# Patient Record
Sex: Female | Born: 1995 | Race: Black or African American | Hispanic: No | Marital: Single | Smoking: Current some day smoker
Health system: Southern US, Community
[De-identification: ages and names within clinical notes are randomized; demographics above are authoritative.]

## PROBLEM LIST (undated history)

## (undated) ENCOUNTER — Inpatient Hospital Stay (HOSPITAL_COMMUNITY): Payer: Self-pay

## (undated) DIAGNOSIS — B999 Unspecified infectious disease: Secondary | ICD-10-CM

## (undated) DIAGNOSIS — Z789 Other specified health status: Secondary | ICD-10-CM

## (undated) DIAGNOSIS — F419 Anxiety disorder, unspecified: Secondary | ICD-10-CM

## (undated) HISTORY — PX: NO PAST SURGERIES: SHX2092

## (undated) HISTORY — PX: THERAPEUTIC ABORTION: SHX798

---
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
## 2014-04-15 ENCOUNTER — Other Ambulatory Visit (HOSPITAL_COMMUNITY)
Admission: RE | Admit: 2014-04-15 | Discharge: 2014-04-15 | Disposition: A | Discharge status: Home / Self Care | Payer: 59 | Source: Ambulatory Visit | Attending: Emergency Medicine | Admitting: Emergency Medicine

## 2014-04-15 ENCOUNTER — Encounter (HOSPITAL_COMMUNITY): Payer: Self-pay

## 2014-04-15 ENCOUNTER — Emergency Department (INDEPENDENT_AMBULATORY_CARE_PROVIDER_SITE_OTHER)
Admission: EM | Admit: 2014-04-15 | Discharge: 2014-04-15 | Disposition: A | Discharge status: Home / Self Care | Payer: 59 | Source: Home / Self Care | Attending: Emergency Medicine | Admitting: Emergency Medicine

## 2014-04-15 DIAGNOSIS — Z113 Encounter for screening for infections with a predominantly sexual mode of transmission: Secondary | ICD-10-CM | POA: Insufficient documentation

## 2014-04-15 DIAGNOSIS — N760 Acute vaginitis: Secondary | ICD-10-CM | POA: Diagnosis present

## 2014-04-15 DIAGNOSIS — L253 Unspecified contact dermatitis due to other chemical products: Secondary | ICD-10-CM

## 2014-04-15 MED ORDER — PREDNISONE 10 MG PO TABS: ORAL_TABLET | ORAL | Status: DC

## 2014-04-15 MED ORDER — PREDNISONE 20 MG PO TABS
60.0000 mg | ORAL_TABLET | Freq: Once | ORAL | Status: AC
  Administered 2014-04-15: 60 mg via ORAL

## 2014-04-15 MED ORDER — IBUPROFEN 800 MG PO TABS
ORAL_TABLET | ORAL | Status: AC
  Filled 2014-04-15: qty 1

## 2014-04-15 MED ORDER — IBUPROFEN 800 MG PO TABS
800.0000 mg | ORAL_TABLET | Freq: Once | ORAL | Status: AC
  Administered 2014-04-15: 800 mg via ORAL

## 2014-04-15 MED ORDER — PREDNISONE 20 MG PO TABS
ORAL_TABLET | ORAL | Status: AC
  Filled 2014-04-15: qty 3

## 2014-04-15 NOTE — ED Notes (Signed)
Provider evaluation only 

## 2014-04-15 NOTE — ED Provider Notes (Signed)
CSN: [id number]     Arrival date & time 04/15/14  1717 History   First MD Initiated Contact with Patient 04/15/14 1938     Chief Complaint  Patient presents with  . Vaginitis   (Consider location/radiation/quality/duration/timing/severity/associated sxs/prior Treatment) HPI Comments: Patient states she had intercourse last night and latex condom was used. This morning she woke with uncomfortable vaginal swelling that has persisted throughout the day. When asked, patient does recall previous rash to both hands after wearing latex work gloves a few years ago. Has never had formal allergy testing. Denies skin rash, swelling of lips, face, throat or tongue. No difficulty breathing, speaking or swallowing. Has not tried any medications, topical or oral, since symptoms began. Denies vaginal bleeding or discharge. Patient states she is allergic to benadryl.   The history is provided by the patient.    History reviewed. No pertinent past medical history. History reviewed. No pertinent past surgical history. History reviewed. No pertinent family history. History  Substance Use Topics  . Smoking status: Current Every Day Smoker  . Smokeless tobacco: Not on file  . Alcohol Use: Yes   OB History    No data available     Review of Systems  All other systems reviewed and are negative.   Allergies  Benadryl  Home Medications   Prior to Admission medications   Medication Sig Start Date End Date Taking? Authorizing Provider  predniSONE (DELTASONE) 10 MG tablet Beginning 04/16/2014 (evening) take 4 tabs po QD day 1, 3 tabs po QD day 2, 2 tabs po QD day 3, 1 tab po QD day 4 then stop 04/15/14   Ria Clock, PA  traMADol (ULTRAM) 50 MG tablet Take 1 tablet (50 mg total) by mouth every 6 (six) hours as needed. 10/09/13   Servando Salina, NP   BP 104/74 mmHg  Pulse 95  Temp(Src) 98.9 F (37.2 C) (Oral)  Resp 16  SpO2 100% Physical Exam  Constitutional: She is oriented to person,  place, and time. She appears well-developed and well-nourished. No distress.  HENT:  Head: Normocephalic and atraumatic.  Mouth/Throat: Uvula is midline, oropharynx is clear and moist and mucous membranes are normal. No uvula swelling. No posterior oropharyngeal edema.  Eyes: Conjunctivae are normal.  Cardiovascular: Normal rate.   Pulmonary/Chest: Effort normal and breath sounds normal. No respiratory distress. She has no wheezes.  Genitourinary: Pelvic exam was performed with patient supine. There is tenderness on the right labia. There is tenderness on the left labia.  No speculum exam performed as patient is quite uncomfortable as a result of moderate swelling and edema of bilateral labia minora. No visible bleeding or discharge Blind vaginal swabs obtained.   Musculoskeletal: Normal range of motion.  Neurological: She is alert and oriented to person, place, and time.  Skin: Skin is warm and dry.  Psychiatric: She has a normal mood and affect. Her behavior is normal.  Nursing note and vitals reviewed.   ED Course  Procedures (including critical care time) Labs Review Labs Reviewed  CERVICOVAGINAL ANCILLARY ONLY    Imaging Review No results found.   MDM   1. Latex allergy, contact dermatitis   Prednisone taper as prescribed. NO benadryl given as patient lists this medication as allergy. Advised her to consider herself latex sensitive/allergic and should continue to use condoms, but to make sure that condoms are latex free. Advised to return if no improvement.  If swelling of lips, throat or tongue, advised to report directly to  her nearest ER for assistance.  STI swabs sent. Will contact if results indicate need for treatment.   Ria ClockJennifer Lee H Presson, [place] 04/15/14 2013

## 2014-04-15 NOTE — ED Notes (Signed)
Please call at : [phone] for lab report issues. Provided w ice pack for comfort

## 2014-04-15 NOTE — ED Notes (Signed)
Swabs obtained by provider via vaginal route. No pelvic was done due to patient level of discomfort

## 2014-04-15 NOTE — Discharge Instructions (Signed)
Contact Dermatitis °Contact dermatitis is a reaction to certain substances that touch the skin. Contact dermatitis can be either irritant contact dermatitis or allergic contact dermatitis. Irritant contact dermatitis does not require previous exposure to the substance for a reaction to occur. Allergic contact dermatitis only occurs if you have been exposed to the substance before. Upon a repeat exposure, your body reacts to the substance.  °CAUSES  °Many substances can cause contact dermatitis. Irritant dermatitis is most commonly caused by repeated exposure to mildly irritating substances, such as: °· Makeup. °· Soaps. °· Detergents. °· Bleaches. °· Acids. °· Metal salts, such as nickel. °Allergic contact dermatitis is most commonly caused by exposure to: °· Poisonous plants. °· Chemicals (deodorants, shampoos). °· Jewelry. °· Latex. °· Neomycin in triple antibiotic cream. °· Preservatives in products, including clothing. °SYMPTOMS  °The area of skin that is exposed may develop: °· Dryness or flaking. °· Redness. °· Cracks. °· Itching. °· Pain or a burning sensation. °· Blisters. °With allergic contact dermatitis, there may also be swelling in areas such as the eyelids, mouth, or genitals.  °DIAGNOSIS  °Your caregiver can usually tell what the problem is by doing a physical exam. In cases where the cause is uncertain and an allergic contact dermatitis is suspected, a patch skin test may be performed to help determine the cause of your dermatitis. °TREATMENT °Treatment includes protecting the skin from further contact with the irritating substance by avoiding that substance if possible. Barrier creams, powders, and gloves may be helpful. Your caregiver may also recommend: °· Steroid creams or ointments applied 2 times daily. For best results, soak the rash area in cool water for 20 minutes. Then apply the medicine. Cover the area with a plastic wrap. You can store the steroid cream in the refrigerator for a "chilly"  effect on your rash. That may decrease itching. Oral steroid medicines may be needed in more severe cases. °· Antibiotics or antibacterial ointments if a skin infection is present. °· Antihistamine lotion or an antihistamine taken by mouth to ease itching. °· Lubricants to keep moisture in your skin. °· Burow's solution to reduce redness and soreness or to dry a weeping rash. Mix one packet or tablet of solution in 2 cups cool water. Dip a clean washcloth in the mixture, wring it out a bit, and put it on the affected area. Leave the cloth in place for 30 minutes. Do this as often as possible throughout the day. °· Taking several cornstarch or baking soda baths daily if the area is too large to cover with a washcloth. °Harsh chemicals, such as alkalis or acids, can cause skin damage that is like a burn. You should flush your skin for 15 to 20 minutes with cold water after such an exposure. You should also seek immediate medical care after exposure. Bandages (dressings), antibiotics, and pain medicine may be needed for severely irritated skin.  °HOME CARE INSTRUCTIONS °· Avoid the substance that caused your reaction. °· Keep the area of skin that is affected away from hot water, soap, sunlight, chemicals, acidic substances, or anything else that would irritate your skin. °· Do not scratch the rash. Scratching may cause the rash to become infected. °· You may take cool baths to help stop the itching. °· Only take over-the-counter or prescription medicines as directed by your caregiver. °· See your caregiver for follow-up care as directed to make sure your skin is healing properly. °SEEK MEDICAL CARE IF:  °· Your condition is not better after 3   days of treatment.  You seem to be getting worse.  You see signs of infection such as swelling, tenderness, redness, soreness, or warmth in the affected area.  You have any problems related to your medicines. Document Released: 02/06/2000 Document Revised: 05/03/2011  Document Reviewed: 07/14/2010 [facility] Patient Information 2015 [place], [place]. This information is not intended to replace advice given to you by your health care provider. Make sure you discuss any questions you have with your health care provider.  Latex Allergy Latex allergies are a reaction to the flexible, elastic material used in many rubber products. Examples of these products are rubber gloves, adhesive tape and bandages, rubber toys, balloons, rubber bands, condoms and many other items. Particles of latex can also be inhaled once they become airborne. When very high levels of latex are present, asthma symptoms can be triggered. Latex allergies also are related to certain foods such as avocados, bananas, chestnuts, kiwis and passion fruits. If you're allergic to latex, you have a greater chance of also being allergic to these foods. SYMPTOMS   Stuffy nose and/or sneezing.  Cough.  Hives or rash.  Itching.  Itchy and watery eyes.  Difficulty breathing. HOME CARE INSTRUCTIONS   Avoid your exposure to latex products.  Be careful of products labeled hypoallergenic. This label does not mean these products do not contain latex.  See a doctor for allergy testing.  If a doctor confirms a Latex Allergy, wear a medical alert bracelet or necklace that identifies your allergy.  Take any medications exactly as prescribed. SEEK IMMEDIATE MEDICAL CARE IF:   You have difficulty breathing.  You have chest tightness.  You have confusion or slurred speech.  You have redness, swelling, or pain that is getting worse not better. MAKE SURE YOU:   Understand these instructions.  Will watch your condition.  Will get help right away if you are not doing well or get worse. Document Released: 11/15/2003 Document Revised: 05/03/2011 Document Reviewed: 01/03/2008 [facility] Patient Information 2015 [place], [place]. This information is not intended to replace advice given to you by your  health care provider. Make sure you discuss any questions you have with your health care provider.

## 2014-04-16 LAB — CERVICOVAGINAL ANCILLARY ONLY
Chlamydia: NEGATIVE
Neisseria Gonorrhea: NEGATIVE

## 2014-04-17 LAB — CERVICOVAGINAL ANCILLARY ONLY
Wet Prep (BD Affirm): NEGATIVE
Wet Prep (BD Affirm): NEGATIVE
Wet Prep (BD Affirm): POSITIVE — AB

## 2014-04-18 NOTE — ED Notes (Signed)
GC/Chlamydia neg., Affirm: Candida pos., Gardnerella and Trich neg.  Message sent to Rite AidLee Presson PA. 04/18/2014

## 2014-04-19 MED ORDER — FLUCONAZOLE 150 MG PO TABS: 150.0000 mg | ORAL_TABLET | Freq: Every day | ORAL | Status: DC

## 2014-04-19 NOTE — Progress Notes (Signed)
04/19/2014: Called patient and left voice mail for patient to call clinic. Printed a prescription for diflucan for patient to pick up at front desk. Request sent to lab RN to follow up with patient to make her aware of results and available prescription.

## 2014-04-20 ENCOUNTER — Telehealth (HOSPITAL_COMMUNITY): Payer: Self-pay | Admitting: *Deleted

## 2014-04-20 NOTE — ED Notes (Addendum)
Narda BondsLee Presson PA left Rx. At front desk.  I called pt.'s number and it is no longer her number.  [place] contact [place] and left a message for pt. to call.  Call 1. 04/20/2014 Pt. returned call and left a number for her to be reached.  I called pt. back.  Pt. verified x 2 and given results.  Pt. told she needs Diflucan for the yeast infection.  She needs to come back to pick up the Rx.  Pt. voiced understanding. 04/20/2014

## 2014-11-16 ENCOUNTER — Inpatient Hospital Stay (HOSPITAL_COMMUNITY)
Admission: AD | Admit: 2014-11-16 | Discharge: 2014-11-16 | Disposition: A | Discharge status: Home / Self Care | Payer: Medicaid Other | Source: Ambulatory Visit | Attending: Obstetrics and Gynecology | Admitting: Obstetrics and Gynecology

## 2014-11-16 ENCOUNTER — Encounter (HOSPITAL_COMMUNITY): Payer: Self-pay | Admitting: *Deleted

## 2014-11-16 DIAGNOSIS — N898 Other specified noninflammatory disorders of vagina: Secondary | ICD-10-CM | POA: Diagnosis present

## 2014-11-16 DIAGNOSIS — N760 Acute vaginitis: Secondary | ICD-10-CM | POA: Diagnosis not present

## 2014-11-16 DIAGNOSIS — B3731 Acute candidiasis of vulva and vagina: Secondary | ICD-10-CM

## 2014-11-16 DIAGNOSIS — B373 Candidiasis of vulva and vagina: Secondary | ICD-10-CM | POA: Insufficient documentation

## 2014-11-16 HISTORY — DX: Other specified health status: Z78.9

## 2014-11-16 LAB — WET PREP, GENITAL
Clue Cells Wet Prep HPF POC: NONE SEEN
TRICH WET PREP: NONE SEEN

## 2014-11-16 LAB — URINALYSIS, ROUTINE W REFLEX MICROSCOPIC
Bilirubin Urine: NEGATIVE
Glucose, UA: NEGATIVE mg/dL
Hgb urine dipstick: NEGATIVE
Ketones, ur: NEGATIVE mg/dL
Nitrite: NEGATIVE
PH: 6 (ref 5.0–8.0)
PROTEIN: NEGATIVE mg/dL
Specific Gravity, Urine: >1.03 — ABNORMAL HIGH (ref 1.005–1.030)
Urobilinogen, UA: 0.2 mg/dL (ref 0.0–1.0)

## 2014-11-16 LAB — CBC
HCT: 35.4 % — ABNORMAL LOW (ref 36.0–46.0)
Hemoglobin: 11.4 g/dL — ABNORMAL LOW (ref 12.0–15.0)
MCH: 31.1 pg (ref 26.0–34.0)
MCHC: 32.2 g/dL (ref 30.0–36.0)
MCV: 96.5 fL (ref 78.0–100.0)
PLATELETS: 315 10*3/uL (ref 150–400)
RBC: 3.67 MIL/uL — ABNORMAL LOW (ref 3.87–5.11)
RDW: 13.3 % (ref 11.5–15.5)
WBC: 6.9 10*3/uL (ref 4.0–10.5)

## 2014-11-16 LAB — URINE MICROSCOPIC-ADD ON

## 2014-11-16 LAB — POCT PREGNANCY, URINE: Preg Test, Ur: NEGATIVE

## 2014-11-16 MED ORDER — FLUCONAZOLE 150 MG PO TABS: 150.0000 mg | ORAL_TABLET | Freq: Every day | ORAL | Status: DC

## 2014-11-16 NOTE — Discharge Instructions (Signed)

## 2014-11-16 NOTE — MAU Provider Note (Signed)
History     CSN: [id number]  Arrival date and time: 11/16/14 1538   First Provider Initiated Contact with Patient 11/16/14 1626         Chief Complaint  Patient presents with  . Vaginitis   HPI Sara Chen is a 19 y.o. female who presents for vaginal discharge and irritation.  Symptoms started 4 days ago. Moderate amount of thick white discharge. No odor. Does have itching and irritation. Pink spotting on toilet paper; she thinks from scratching too much and breaking skin.  Denies abdominal pain, fever, or dysuria.  Sexually active. Use OCPs and condoms.  Desires STD testing.   OB History    Gravida Para Term Preterm AB TAB SAB Ectopic Multiple Living        Past Medical History  Diagnosis Date  . Medical history non-contributory     Past Surgical History  Procedure Laterality Date  . No past surgeries      History reviewed. No pertinent family history.  Social History  Substance Use Topics  . Smoking status: Current Every Day Smoker  . Smokeless tobacco: None  . Alcohol Use: Yes    Allergies:  Allergies  Allergen Reactions  . Latex Swelling  . Benadryl [Diphenhydramine] Rash    Prescriptions prior to admission  Medication Sig Dispense Refill Last Dose  . fluconazole (DIFLUCAN) 150 MG tablet Take 1 tablet (150 mg total) by mouth daily. X 1 dose 1 tablet 0   . predniSONE (DELTASONE) 10 MG tablet Beginning 04/16/2014 (evening) take 4 tabs po QD day 1, 3 tabs po QD day 2, 2 tabs po QD day 3, 1 tab po QD day 4 then stop 10 tablet 0   . traMADol (ULTRAM) 50 MG tablet Take 1 tablet (50 mg total) by mouth every 6 (six) hours as needed. 15 tablet 0     Review of Systems  Constitutional: Negative.   Gastrointestinal: Negative.   Genitourinary: Negative for dysuria.       + vaginal discharge    Physical Exam   Blood pressure 136/83, pulse 112, temperature 98.5 F (36.9 C), temperature source Oral, resp. rate 16, height  (1.575  m), weight 125 lb (56.7 kg), last menstrual period 11/03/2014.  Physical Exam  Nursing note and vitals reviewed. Constitutional: She is oriented to person, place, and time. She appears well-developed and well-nourished. No distress.  HENT:  Head: Normocephalic and atraumatic.  Eyes: Conjunctivae are normal. Right eye exhibits no discharge. Left eye exhibits no discharge. No scleral icterus.  Neck: Normal range of motion.  Cardiovascular:  No murmur heard. Respiratory: Effort normal. No respiratory distress.  GI: Soft. There is no tenderness.  Genitourinary: Uterus normal. Cervix exhibits discharge. Cervix exhibits no motion tenderness and no friability. Right adnexum displays no mass, no tenderness and no fullness. Left adnexum displays no mass, no tenderness and no fullness. There is erythema and tenderness in the vagina.  Edema & erythema of bilateral labia majora. Areas of excoriated skin.  Small amount of clear mucoid discharge at cervical os.  Thick white discharge adherent to vaginal walls.   Neurological: She is alert and oriented to person, place, and time.  Skin: Skin is warm and dry. She is not diaphoretic.  Psychiatric: She has a normal mood and affect. Her behavior is normal. Judgment and thought content normal.    MAU Course  Procedures Results for orders placed or performed during the hospital  encounter of 11/16/14 (from the past 24 hour(s))  Urinalysis, Routine w reflex microscopic (not at [facility])     Status: Abnormal   Collection Time: 11/16/14  3:47 PM  Result Value Ref Range   Color, Urine YELLOW YELLOW   APPearance CLEAR CLEAR   Specific Gravity, Urine >1.030 (H) 1.005 - 1.030   pH 6.0 5.0 - 8.0   Glucose, UA NEGATIVE NEGATIVE mg/dL   Hgb urine dipstick NEGATIVE NEGATIVE   Bilirubin Urine NEGATIVE NEGATIVE   Ketones, ur NEGATIVE NEGATIVE mg/dL   Protein, ur NEGATIVE NEGATIVE mg/dL   Urobilinogen, UA 0.2 0.0 - 1.0 mg/dL   Nitrite NEGATIVE NEGATIVE   Leukocytes,  UA SMALL (A) NEGATIVE  Urine microscopic-add on     Status: Abnormal   Collection Time: 11/16/14  3:47 PM  Result Value Ref Range   Squamous Epithelial / LPF FEW (A) RARE   WBC, UA 0-2 <3 WBC/hpf   Bacteria, UA FEW (A) RARE   Urine-Other MUCOUS PRESENT   Pregnancy, urine POC     Status: None   Collection Time: 11/16/14  4:13 PM  Result Value Ref Range   Preg Test, Ur NEGATIVE NEGATIVE  CBC     Status: Abnormal   Collection Time: 11/16/14  4:40 PM  Result Value Ref Range   WBC 6.9 4.0 - 10.5 K/uL   RBC 3.67 (L) 3.87 - 5.11 MIL/uL   Hemoglobin 11.4 (L) 12.0 - 15.0 g/dL   HCT 16.1 (L) 09.6 - 04.5 %   MCV 96.5 78.0 - 100.0 fL   MCH 31.1 26.0 - 34.0 pg   MCHC 32.2 30.0 - 36.0 g/dL   RDW 40.9 81.1 - 91.4 %   Platelets 315 150 - 400 K/uL  Wet prep, genital     Status: Abnormal   Collection Time: 11/16/14  4:52 PM  Result Value Ref Range   Yeast Wet Prep HPF POC MODERATE (A) NONE SEEN   Trich, Wet Prep NONE SEEN NONE SEEN   Clue Cells Wet Prep HPF POC NONE SEEN NONE SEEN   WBC, Wet Prep HPF POC TOO NUMEROUS TO COUNT (A) NONE SEEN    MDM CBC, HIV, RPR, GC/CT, wet prep UPT negative  Assessment and Plan  A: 1. Yeast infection of the vagina   2. Vaginitis and vulvovaginitis    P: Discharge home Rx flagyl with refill x 1 F/u with PCP if symptoms don't improve or you get another yeast infection GC/CT, HIV, RPR pending Safe sex practices  Judeth Horn, NP  11/16/2014, 4:08 PM

## 2014-11-16 NOTE — MAU Note (Signed)
Patient presents stating that she is not pregnant with c/o vaginal discomfort, itching and discharge X 3-4 days. States she has also seen spotting.

## 2014-11-17 LAB — RPR: RPR Ser Ql: NONREACTIVE

## 2014-11-17 LAB — HIV ANTIBODY (ROUTINE TESTING W REFLEX): HIV SCREEN 4TH GENERATION: NONREACTIVE

## 2014-11-18 LAB — GC/CHLAMYDIA PROBE AMP (~~LOC~~) NOT AT ARMC
Chlamydia: NEGATIVE
Neisseria Gonorrhea: NEGATIVE

## 2015-03-19 ENCOUNTER — Inpatient Hospital Stay (HOSPITAL_COMMUNITY)
Admission: AD | Admit: 2015-03-19 | Discharge: 2015-03-19 | Disposition: A | Discharge status: Home / Self Care | Payer: Medicaid Other | Source: Ambulatory Visit | Attending: Obstetrics & Gynecology | Admitting: Obstetrics & Gynecology

## 2015-03-19 ENCOUNTER — Encounter (HOSPITAL_COMMUNITY): Payer: Self-pay | Admitting: *Deleted

## 2015-03-19 DIAGNOSIS — B3731 Acute candidiasis of vulva and vagina: Secondary | ICD-10-CM

## 2015-03-19 DIAGNOSIS — B373 Candidiasis of vulva and vagina: Secondary | ICD-10-CM | POA: Diagnosis not present

## 2015-03-19 HISTORY — DX: Unspecified infectious disease: B99.9

## 2015-03-19 HISTORY — DX: Anxiety disorder, unspecified: F41.9

## 2015-03-19 LAB — URINALYSIS, ROUTINE W REFLEX MICROSCOPIC
BILIRUBIN URINE: NEGATIVE
Glucose, UA: NEGATIVE mg/dL
Hgb urine dipstick: NEGATIVE
KETONES UR: NEGATIVE mg/dL
LEUKOCYTES UA: NEGATIVE
Nitrite: NEGATIVE
PH: 6 (ref 5.0–8.0)
Protein, ur: NEGATIVE mg/dL
SPECIFIC GRAVITY, URINE: 1.025 (ref 1.005–1.030)

## 2015-03-19 LAB — WET PREP, GENITAL
Clue Cells Wet Prep HPF POC: NONE SEEN
SPERM: NONE SEEN
Trich, Wet Prep: NONE SEEN
YEAST WET PREP: NONE SEEN

## 2015-03-19 LAB — GLUCOSE, CAPILLARY: Glucose-Capillary: 66 mg/dL (ref 65–99)

## 2015-03-19 LAB — POCT PREGNANCY, URINE: PREG TEST UR: NEGATIVE

## 2015-03-19 MED ORDER — FLUCONAZOLE 150 MG PO TABS: 150.0000 mg | ORAL_TABLET | Freq: Once | ORAL | Status: DC

## 2015-03-19 NOTE — MAU Note (Signed)
Thinks she has another yeast infection.  Having a lot of discomfort, irritation, itching and now there is a smell.  Keeps getting them like every 3 months.

## 2015-03-19 NOTE — MAU Provider Note (Signed)
Chief Complaint: Vaginal Discharge   First Provider Initiated Contact with Patient 03/19/15 1312      SUBJECTIVE HPI: Sara Chen is a 20 y.o. G0P0000 who presents to maternity admissions reporting vaginal discharge with itching, and frequent yeast infections.  Nothing makes her symptoms better or worse, she has not tried any medications.  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   She denies vaginal bleeding, urinary symptoms, h/a, dizziness, n/v, or fever/chills.     Vaginal Discharge The patient's primary symptoms include genital itching and vaginal discharge. The patient's pertinent negatives include no pelvic pain. This is a recurrent problem. The current episode started yesterday. The problem occurs constantly. The problem has been unchanged. The patient is experiencing no pain. She is not pregnant. Pertinent negatives include no abdominal pain, chills, constipation, diarrhea, dysuria, fever, flank pain, headaches, nausea or vomiting. The vaginal discharge was malodorous, thick and white. There has been no bleeding. Nothing aggravates the symptoms. Treatments tried: changed soap to dove. The treatment provided no relief. She is sexually active. No, her partner does not have an STD. She uses oral contraceptives for contraception.    Past Medical History  Diagnosis Date  . Medical history non-contributory   . Infection     UTI  . Anxiety    Past Surgical History  Procedure Laterality Date  . No past surgeries     Social History   Social History  . Marital Status: Single    Spouse Name: N/A  . Number of Children: N/A  . Years of Education: N/A   Occupational History  . Not on file.   Social History Main Topics  . Smoking status: Former Games developer  . Smokeless tobacco: Never Used     Comment: was smoking hookah pipe, none in past couple months  . Alcohol Use: Yes     Comment: occ  . Drug Use: No  . Sexual  Activity: Yes    Birth Control/ Protection: Pill, Condom   Other Topics Concern  . Not on file   Social History Narrative   No current facility-administered medications on file prior to encounter.   Current Outpatient Prescriptions on File Prior to Encounter  Medication Sig Dispense Refill  . Norethindrone Acetate-Ethinyl Estradiol (LOESTRIN 1.5/30, 21,) 1.5-30 MG-MCG tablet Take 1 tablet by mouth daily.     Allergies  Allergen Reactions  . Latex Swelling  . Benadryl [Diphenhydramine] Rash    ROS:  Review of Systems  Constitutional: Negative for fever, chills and fatigue.  Respiratory: Negative for shortness of breath.   Cardiovascular: Negative for chest pain.  Gastrointestinal: Negative for nausea, vomiting, abdominal pain, diarrhea and constipation.  Genitourinary: Positive for vaginal discharge. Negative for dysuria, flank pain, vaginal bleeding, difficulty urinating, vaginal pain and pelvic pain.  Neurological: Negative for dizziness and headaches.  Psychiatric/Behavioral: Negative.      I have reviewed patient's Past Medical Hx, Surgical Hx, Family Hx, Social Hx, medications and allergies.   Physical Exam   Patient Vitals for the past 24 hrs:  BP Temp Temp src Pulse Resp Weight  03/19/15 1418 123/78 mmHg - - 84 - -  03/19/15 1245 115/79 mmHg 99 F (37.2 C) Oral 96 16 136 lb 9.6 oz (61.961 kg)   Constitutional: Well-developed, well-nourished female in no acute distress.  Cardiovascular: normal rate Respiratory: normal effort GI: Abd soft, non-tender. Pos BS x 4 MS: Extremities nontender, no edema, normal ROM  Neurologic: Alert and oriented x 4.  GU: Neg CVAT.  PELVIC EXAM: Cervix pink, visually closed, without lesion, thick white discharge with curds, vaginal walls and external genitalia normal Bimanual exam: Cervix 0/long/high, firm, anterior, neg CMT, uterus nontender, nonenlarged, adnexa without tenderness, enlargement, or mass   LAB RESULTS Results for  orders placed or performed during the hospital encounter of 03/19/15 (from the past 24 hour(s))  Urinalysis, Routine w reflex microscopic (not at [facility])     Status: None   Collection Time: 03/19/15 12:45 PM  Result Value Ref Range   Color, Urine YELLOW YELLOW   APPearance CLEAR CLEAR   Specific Gravity, Urine 1.025 1.005 - 1.030   pH 6.0 5.0 - 8.0   Glucose, UA NEGATIVE NEGATIVE mg/dL   Hgb urine dipstick NEGATIVE NEGATIVE   Bilirubin Urine NEGATIVE NEGATIVE   Ketones, ur NEGATIVE NEGATIVE mg/dL   Protein, ur NEGATIVE NEGATIVE mg/dL   Nitrite NEGATIVE NEGATIVE   Leukocytes, UA NEGATIVE NEGATIVE  Pregnancy, urine POC     Status: None   Collection Time: 03/19/15 12:52 PM  Result Value Ref Range   Preg Test, Ur NEGATIVE NEGATIVE  Wet prep, genital     Status: Abnormal   Collection Time: 03/19/15  1:16 PM  Result Value Ref Range   Yeast Wet Prep HPF POC NONE SEEN NONE SEEN   Trich, Wet Prep NONE SEEN NONE SEEN   Clue Cells Wet Prep HPF POC NONE SEEN NONE SEEN   WBC, Wet Prep HPF POC MODERATE (A) NONE SEEN   Sperm NONE SEEN   Glucose, capillary     Status: None   Collection Time: 03/19/15  1:19 PM  Result Value Ref Range   Glucose-Capillary 66 65 - 99 mg/dL       IMAGING No results found.  MAU Management/MDM: Ordered labs and reviewed results.  Although wet prep negative, clinical findings consistent with yeast.  Diflucan Rx sent to pharmacy.  Recommend pt f/u with Gyn as needed.  Pt stable at time of discharge.  ASSESSMENT 1. Yeast vaginitis     PLAN Discharge home Diflucan 150 mg PO x 1, with 1 refill to use PRN GCC, HIV, RPR pending   Medication List    TAKE these medications        fluconazole 150 MG tablet  Commonly known as:  DIFLUCAN  Take 1 tablet (150 mg total) by mouth once.     LOESTRIN 1.5/30 (21) 1.5-30 MG-MCG tablet  Generic drug:  Norethindrone Acetate-Ethinyl Estradiol  Take 1 tablet by mouth daily.       Follow-up Information    Schedule an  appointment as soon as possible for a visit with [facility].   Specialty:  Obstetrics and Gynecology   Why:  Return to MAU as needed for emergencies   Contact information:   [address] [place] [address] [phone]      Please follow up.   Why:  Call [phone] for lab results.      Sharen Counter Certified Nurse-Midwife 03/19/2015  7:28 PM

## 2015-03-19 NOTE — Discharge Instructions (Signed)
Monilial Vaginitis °Vaginitis in a soreness, swelling and redness (inflammation) of the vagina and vulva. Monilial vaginitis is not a sexually transmitted infection. °CAUSES  °Yeast vaginitis is caused by yeast (candida) that is normally found in your vagina. With a yeast infection, the candida has overgrown in number to a point that upsets the chemical balance. °SYMPTOMS  °· White, thick vaginal discharge. °· Swelling, itching, redness and irritation of the vagina and possibly the lips of the vagina (vulva). °· Burning or painful urination. °· Painful intercourse. °DIAGNOSIS  °Things that may contribute to monilial vaginitis are: °· Postmenopausal and virginal states. °· Pregnancy. °· Infections. °· Being tired, sick or stressed, especially if you had monilial vaginitis in the past. °· Diabetes. Good control will help lower the chance. °· Birth control pills. °· Tight fitting garments. °· Using bubble bath, feminine sprays, douches or deodorant tampons. °· Taking certain medications that kill germs (antibiotics). °· Sporadic recurrence can occur if you become ill. °TREATMENT  °Your caregiver will give you medication. °· There are several kinds of anti monilial vaginal creams and suppositories specific for monilial vaginitis. For recurrent yeast infections, use a suppository or cream in the vagina 2 times a week, or as directed. °· Anti-monilial or steroid cream for the itching or irritation of the vulva may also be used. Get your caregiver's permission. °· Painting the vagina with methylene blue solution may help if the monilial cream does not work. °· Eating yogurt may help prevent monilial vaginitis. °HOME CARE INSTRUCTIONS  °· Finish all medication as prescribed. °· Do not have sex until treatment is completed or after your caregiver tells you it is okay. °· Take warm sitz baths. °· Do not douche. °· Do not use tampons, especially scented ones. °· Wear cotton underwear. °· Avoid tight pants and panty  hose. °· Tell your sexual partner that you have a yeast infection. They should go to their caregiver if they have symptoms such as mild rash or itching. °· Your sexual partner should be treated as well if your infection is difficult to eliminate. °· Practice safer sex. Use condoms. °· Some vaginal medications cause latex condoms to fail. Vaginal medications that harm condoms are: °· Cleocin cream. °· Butoconazole (Femstat®). °· Terconazole (Terazol®) vaginal suppository. °· Miconazole (Monistat®) (may be purchased over the counter). °SEEK MEDICAL CARE IF:  °· You have a temperature by mouth above 102° F (38.9° C). °· The infection is getting worse after 2 days of treatment. °· The infection is not getting better after 3 days of treatment. °· You develop blisters in or around your vagina. °· You develop vaginal bleeding, and it is not your menstrual period. °· You have pain when you urinate. °· You develop intestinal problems. °· You have pain with sexual intercourse. °  °This information is not intended to replace advice given to you by your health care provider. Make sure you discuss any questions you have with your health care provider. °  °Document Released: 11/18/2004 Document Revised: 05/03/2011 Document Reviewed: 08/12/2014 °Elsevier Interactive Patient Education ©2016 Elsevier Inc. ° °Probiotics °WHAT ARE PROBIOTICS? °Probiotics are the good bacteria and yeasts that live in your body and keep you and your digestive system healthy. Probiotics also help your body's defense (immune) system and protect your body against bad bacterial growth.  °Certain foods contain probiotics, such as yogurt. Probiotics can also be purchased as a supplement. As with any supplement or drug, it is important to discuss its use with your health care   provider.  °WHAT AFFECTS THE BALANCE OF BACTERIA IN MY BODY? °The balance of bacteria in your body can be affected by:  °· Antibiotic medicines. Antibiotics are sometimes necessary to  treat infection. Unfortunately, they may kill good or friendly bacteria in your body as well as the bad bacteria. This may lead to stomach problems like diarrhea, gas, and cramping. °· Disease. Some conditions are the result of an overgrowth of bad bacteria, yeasts, parasites, or fungi. These conditions include:   °¨ Infectious diarrhea. °¨ Stomach and respiratory infections. °¨ Skin infections. °¨ Irritable bowel syndrome (IBS). °¨ Inflammatory bowel diseases. °¨ Ulcer due to Helicobacter pylori (H. pylori) infection. °¨ Tooth decay and periodontal disease. °¨ Vaginal infections. °Stress and poor diet may also lower the good bacteria in your body.  °WHAT TYPE OF PROBIOTIC IS RIGHT FOR ME? °Probiotics are available over the counter at your local pharmacy, health food, or grocery store. They come in many different forms, combinations of strains, and dosing strengths. Some may need to be refrigerated. Always read the label for storage and usage instructions. °Specific strains have been shown to be more effective for certain conditions. Ask your health care provider what option is best for you.  °WHY WOULD I NEED PROBIOTICS? °There are many reasons your health care provider might recommend a probiotic supplement, including:  °· Diarrhea. °· Constipation. °· IBS. °· Respiratory infections. °· Yeast infections. °· Acne, eczema, and other skin conditions. °· Frequent urinary tract infections (UTIs). °ARE THERE SIDE EFFECTS OF PROBIOTICS? °Some people experience mild side effects when taking probiotics. Side effects are usually temporary and may include:  °· Gas. °· Bloating. °· Cramping. °Rarely, serious side effects, such as infection or immune system changes, may occur. °WHAT ELSE DO I NEED TO KNOW ABOUT PROBIOTICS?  °· There are many different strains of probiotics. Certain strains may be more effective depending on your condition. Probiotics are available in varying doses. Ask your health care provider which probiotic  you should use and how often.   °· If you are taking probiotics along with antibiotics, it is generally recommended to wait at least 2 hours between taking the antibiotic and taking the probiotic.   °FOR MORE INFORMATION:  °National Center for Complementary and Alternative Medicine http://nccam.nih.gov/ °  °This information is not intended to replace advice given to you by your health care provider. Make sure you discuss any questions you have with your health care provider. °  °Document Released: 09/05/2013 Document Reviewed: 09/05/2013 °Elsevier Interactive Patient Education ©2016 Elsevier Inc. ° °

## 2015-03-20 LAB — HIV ANTIBODY (ROUTINE TESTING W REFLEX): HIV SCREEN 4TH GENERATION: NONREACTIVE

## 2015-03-20 LAB — RPR: RPR: NONREACTIVE

## 2015-03-20 LAB — GC/CHLAMYDIA PROBE AMP (~~LOC~~) NOT AT ARMC
Chlamydia: NEGATIVE
Neisseria Gonorrhea: NEGATIVE

## 2015-11-01 ENCOUNTER — Encounter (HOSPITAL_COMMUNITY): Payer: Self-pay

## 2015-11-01 ENCOUNTER — Inpatient Hospital Stay (HOSPITAL_COMMUNITY)
Admission: AD | Admit: 2015-11-01 | Discharge: 2015-11-01 | Disposition: A | Discharge status: Home / Self Care | Payer: Medicaid Other | Source: Ambulatory Visit | Attending: Obstetrics and Gynecology | Admitting: Obstetrics and Gynecology

## 2015-11-01 ENCOUNTER — Inpatient Hospital Stay (HOSPITAL_COMMUNITY): Payer: Medicaid Other

## 2015-11-01 DIAGNOSIS — F419 Anxiety disorder, unspecified: Secondary | ICD-10-CM | POA: Diagnosis not present

## 2015-11-01 DIAGNOSIS — O98812 Other maternal infectious and parasitic diseases complicating pregnancy, second trimester: Secondary | ICD-10-CM | POA: Diagnosis not present

## 2015-11-01 DIAGNOSIS — N760 Acute vaginitis: Secondary | ICD-10-CM | POA: Insufficient documentation

## 2015-11-01 DIAGNOSIS — Z3491 Encounter for supervision of normal pregnancy, unspecified, first trimester: Secondary | ICD-10-CM

## 2015-11-01 DIAGNOSIS — O26891 Other specified pregnancy related conditions, first trimester: Secondary | ICD-10-CM | POA: Diagnosis not present

## 2015-11-01 DIAGNOSIS — Z888 Allergy status to other drugs, medicaments and biological substances status: Secondary | ICD-10-CM | POA: Insufficient documentation

## 2015-11-01 DIAGNOSIS — O98811 Other maternal infectious and parasitic diseases complicating pregnancy, first trimester: Secondary | ICD-10-CM

## 2015-11-01 DIAGNOSIS — Z91040 Latex allergy status: Secondary | ICD-10-CM | POA: Diagnosis not present

## 2015-11-01 DIAGNOSIS — O23591 Infection of other part of genital tract in pregnancy, first trimester: Secondary | ICD-10-CM

## 2015-11-01 DIAGNOSIS — O30041 Twin pregnancy, dichorionic/diamniotic, first trimester: Secondary | ICD-10-CM | POA: Insufficient documentation

## 2015-11-01 DIAGNOSIS — B373 Candidiasis of vulva and vagina: Secondary | ICD-10-CM | POA: Diagnosis not present

## 2015-11-01 DIAGNOSIS — Z3A01 Less than 8 weeks gestation of pregnancy: Secondary | ICD-10-CM | POA: Insufficient documentation

## 2015-11-01 DIAGNOSIS — R102 Pelvic and perineal pain: Secondary | ICD-10-CM | POA: Diagnosis not present

## 2015-11-01 DIAGNOSIS — B3731 Acute candidiasis of vulva and vagina: Secondary | ICD-10-CM

## 2015-11-01 DIAGNOSIS — N83201 Unspecified ovarian cyst, right side: Secondary | ICD-10-CM | POA: Insufficient documentation

## 2015-11-01 DIAGNOSIS — Z3201 Encounter for pregnancy test, result positive: Secondary | ICD-10-CM | POA: Diagnosis not present

## 2015-11-01 DIAGNOSIS — Z87891 Personal history of nicotine dependence: Secondary | ICD-10-CM | POA: Diagnosis not present

## 2015-11-01 DIAGNOSIS — O26892 Other specified pregnancy related conditions, second trimester: Secondary | ICD-10-CM | POA: Diagnosis present

## 2015-11-01 DIAGNOSIS — B9689 Other specified bacterial agents as the cause of diseases classified elsewhere: Secondary | ICD-10-CM

## 2015-11-01 DIAGNOSIS — Z87440 Personal history of urinary (tract) infections: Secondary | ICD-10-CM | POA: Insufficient documentation

## 2015-11-01 DIAGNOSIS — O99342 Other mental disorders complicating pregnancy, second trimester: Secondary | ICD-10-CM | POA: Insufficient documentation

## 2015-11-01 LAB — URINALYSIS, ROUTINE W REFLEX MICROSCOPIC
BILIRUBIN URINE: NEGATIVE
GLUCOSE, UA: NEGATIVE mg/dL
HGB URINE DIPSTICK: NEGATIVE
KETONES UR: NEGATIVE mg/dL
Nitrite: NEGATIVE
PH: 6 (ref 5.0–8.0)
PROTEIN: NEGATIVE mg/dL
Specific Gravity, Urine: 1.025 (ref 1.005–1.030)

## 2015-11-01 LAB — CBC
HEMATOCRIT: 35.8 % — AB (ref 36.0–46.0)
HEMOGLOBIN: 11.7 g/dL — AB (ref 12.0–15.0)
MCH: 31.4 pg (ref 26.0–34.0)
MCHC: 32.7 g/dL (ref 30.0–36.0)
MCV: 96 fL (ref 78.0–100.0)
PLATELETS: 316 10*3/uL (ref 150–400)
RBC: 3.73 MIL/uL — AB (ref 3.87–5.11)
RDW: 13.7 % (ref 11.5–15.5)
WBC: 9.3 10*3/uL (ref 4.0–10.5)

## 2015-11-01 LAB — URINE MICROSCOPIC-ADD ON

## 2015-11-01 LAB — ABO/RH: ABO/RH(D): O POS

## 2015-11-01 LAB — WET PREP, GENITAL
SPERM: NONE SEEN
TRICH WET PREP: NONE SEEN

## 2015-11-01 LAB — HCG, QUANTITATIVE, PREGNANCY: hCG, Beta Chain, Quant, S: 5495 m[IU]/mL — ABNORMAL HIGH (ref <5)

## 2015-11-01 LAB — POCT PREGNANCY, URINE: Preg Test, Ur: POSITIVE — AB

## 2015-11-01 MED ORDER — FLUCONAZOLE 150 MG PO TABS: 150.0000 mg | ORAL_TABLET | Freq: Once | ORAL | 1 refills | Status: AC

## 2015-11-01 MED ORDER — METRONIDAZOLE 500 MG PO TABS: 500.0000 mg | ORAL_TABLET | Freq: Two times a day (BID) | ORAL | 0 refills | Status: DC

## 2015-11-01 MED ORDER — ACETAMINOPHEN 325 MG PO TABS
650.0000 mg | ORAL_TABLET | Freq: Once | ORAL | Status: AC
  Administered 2015-11-01: 650 mg via ORAL
  Filled 2015-11-01: qty 2

## 2015-11-01 NOTE — MAU Provider Note (Signed)
Chief Complaint: Abdominal Pain   None     SUBJECTIVE HPI: Sara Chen is a 20 y.o. G1P0000 at [redacted]w[redacted]d by LMP who presents to maternity admissions reporting sharp lower abdominal pain, on both left and right sides x 1 week.  The pain is intermittent, resolved after taking ibuprofen, and is unchanged sine onset. She is not using contraception.  She does not desire pregnancy.  She reports onset of h/a while in MAU, moderate, frontal, not associated with light sensitivity or nausea. She denies vaginal bleeding, vaginal itching/burning, urinary symptoms,  dizziness, n/v, or fever/chills.     HPI  Past Medical History:  Diagnosis Date  . Anxiety   . Infection    UTI  . Medical history non-contributory    Past Surgical History:  Procedure Laterality Date  . NO PAST SURGERIES     Social History   Social History  . Marital status: Single    Spouse name: N/A  . Number of children: N/A  . Years of education: N/A   Occupational History  . Not on file.   Social History Main Topics  . Smoking status: Former Games developer  . Smokeless tobacco: Never Used     Comment: was smoking hookah pipe, none in past couple months  . Alcohol use Yes     Comment: occ  . Drug use: No  . Sexual activity: Yes    Birth control/ protection: Pill, Condom   Other Topics Concern  . Not on file   Social History Narrative  . No narrative on file   No current facility-administered medications on file prior to encounter.    No current outpatient prescriptions on file prior to encounter.   Allergies  Allergen Reactions  . Latex Swelling  . Benadryl [Diphenhydramine] Rash    ROS:  Review of Systems  Constitutional: Negative for chills, fatigue and fever.  Respiratory: Negative for shortness of breath.   Cardiovascular: Negative for chest pain.  Genitourinary: Positive for pelvic pain. Negative for difficulty urinating, dysuria, flank pain, vaginal bleeding, vaginal discharge and vaginal pain.   Neurological: Negative for dizziness and headaches.  Psychiatric/Behavioral: Negative.      I have reviewed patient's Past Medical Hx, Surgical Hx, Family Hx, Social Hx, medications and allergies.   Physical Exam   Patient Vitals for the past 24 hrs:  BP Temp Pulse Resp Height Weight  11/01/15 1255 125/76 - 105 16 - -  11/01/15 1001 146/90 98.3 F (36.8 C) 117 20 5\' 2"  (1.575 m) 137 lb 3.2 oz (62.2 kg)   Constitutional: Well-developed, well-nourished female in no acute distress.  Cardiovascular: normal rate Respiratory: normal effort GI: Abd soft, non-tender. Pos BS x 4 MS: Extremities nontender, no edema, normal ROM Neurologic: Alert and oriented x 4.  GU: Neg CVAT.  PELVIC EXAM: Cervix pink, visually closed, without lesion, scant white creamy discharge, vaginal walls and external genitalia normal Bimanual exam: Cervix 0/long/high, firm, anterior, neg CMT, uterus nontender, nonenlarged, adnexa without tenderness, enlargement, or mass  LAB RESULTS Results for orders placed or performed during the hospital encounter of 11/01/15 (from the past 24 hour(s))  Urinalysis, Routine w reflex microscopic (not at [facility])     Status: Abnormal   Collection Time: 11/01/15 10:00 AM  Result Value Ref Range   Color, Urine YELLOW YELLOW   APPearance CLEAR CLEAR   Specific Gravity, Urine 1.025 1.005 - 1.030   pH 6.0 5.0 - 8.0   Glucose, UA NEGATIVE NEGATIVE mg/dL   Hgb urine dipstick  NEGATIVE NEGATIVE   Bilirubin Urine NEGATIVE NEGATIVE   Ketones, ur NEGATIVE NEGATIVE mg/dL   Protein, ur NEGATIVE NEGATIVE mg/dL   Nitrite NEGATIVE NEGATIVE   Leukocytes, UA TRACE (A) NEGATIVE  Urine microscopic-add on     Status: Abnormal   Collection Time: 11/01/15 10:00 AM  Result Value Ref Range   Squamous Epithelial / LPF 0-5 (A) NONE SEEN   WBC, UA 0-5 0 - 5 WBC/hpf   RBC / HPF 0-5 0 - 5 RBC/hpf   Bacteria, UA FEW (A) NONE SEEN  Pregnancy, urine POC     Status: Abnormal   Collection Time: 11/01/15  10:25 AM  Result Value Ref Range   Preg Test, Ur POSITIVE (A) NEGATIVE  CBC     Status: Abnormal   Collection Time: 11/01/15 11:06 AM  Result Value Ref Range   WBC 9.3 4.0 - 10.5 K/uL   RBC 3.73 (L) 3.87 - 5.11 MIL/uL   Hemoglobin 11.7 (L) 12.0 - 15.0 g/dL   HCT 16.135.8 (L) 09.636.0 - 04.546.0 %   MCV 96.0 78.0 - 100.0 fL   MCH 31.4 26.0 - 34.0 pg   MCHC 32.7 30.0 - 36.0 g/dL   RDW 40.913.7 81.111.5 - 91.415.5 %   Platelets 316 150 - 400 K/uL  hCG, quantitative, pregnancy     Status: Abnormal   Collection Time: 11/01/15 11:06 AM  Result Value Ref Range   hCG, Beta Chain, Quant, S 5,495 (H) <5 mIU/mL  ABO/Rh     Status: None (Preliminary result)   Collection Time: 11/01/15 11:06 AM  Result Value Ref Range   ABO/RH(D) O POS   Wet prep, genital     Status: Abnormal   Collection Time: 11/01/15 11:14 AM  Result Value Ref Range   Yeast Wet Prep HPF POC PRESENT (A) NONE SEEN   Trich, Wet Prep NONE SEEN NONE SEEN   Clue Cells Wet Prep HPF POC PRESENT (A) NONE SEEN   WBC, Wet Prep HPF POC FEW (A) NONE SEEN   Sperm NONE SEEN     --/--/O POS (09/09 1106)  IMAGING [place] Ob Comp Less 14 Wks  Result Date: 11/01/2015 CLINICAL DATA:  Patient with intermittent pelvic pain and cramping. Twin gestation. EXAM: TWIN OBSTETRIC <14WK US AND TRANSVAGINAL OB US COMPARISON:  None. FINDINGS: Number of IUPs:  2 Chorionicity/Amnionicity:  Dichorionic-diamniotic (thick membrane) TWIN 1 Yolk sac:  Present Embryo:  Not present Cardiac Activity: Not present MSD: 5.9  mm   5 w   2  d TWIN 2 Yolk sac:  Present Embryo:  Not present Cardiac Activity: Not present MSD: 5.6  mm   5 w   2  d Subchorionic hemorrhage:  None visualized. Maternal uterus/adnexae: Normal right and left ovaries. Corpus luteal cysts demonstrated within the right ovary. Small amount of free fluid in the pelvis. IMPRESSION: Findings most compatible with twin intrauterine gestation with 2 gestational sacs and yolk sacs identified. Recommend follow-up quantitative B-HCG  levels and follow-up US in 14 days to confirm and assess viability. This recommendation follows SRU consensus guidelines: Diagnostic Criteria for Nonviable Pregnancy Early in the First Trimester. Malva Limes Engl J Med 2013; 782:9562-13; 369:1443-51. Electronically Signed   By: Annia Beltrew  Davis M.D.   On: 11/01/2015 12:14   [place] Ob Comp Addl Gest Less 14 Wks  Result Date: 11/01/2015 CLINICAL DATA:  Patient with intermittent pelvic pain and cramping. Twin gestation. EXAM: TWIN OBSTETRIC <14WK US AND TRANSVAGINAL OB US COMPARISON:  None. FINDINGS: Number of IUPs:  2  Chorionicity/Amnionicity:  Dichorionic-diamniotic (thick membrane) TWIN 1 Yolk sac:  Present Embryo:  Not present Cardiac Activity: Not present MSD: 5.9  mm   5 w   2  d TWIN 2 Yolk sac:  Present Embryo:  Not present Cardiac Activity: Not present MSD: 5.6  mm   5 w   2  d Subchorionic hemorrhage:  None visualized. Maternal uterus/adnexae: Normal right and left ovaries. Corpus luteal cysts demonstrated within the right ovary. Small amount of free fluid in the pelvis. IMPRESSION: Findings most compatible with twin intrauterine gestation with 2 gestational sacs and yolk sacs identified. Recommend follow-up quantitative B-HCG levels and follow-up US in 14 days to confirm and assess viability. This recommendation follows SRU consensus guidelines: Diagnostic Criteria for Nonviable Pregnancy Early in the First Trimester. Malva Limes Med 2013; 161:0960-45. Electronically Signed   By: Annia Belt M.D.   On: 11/01/2015 12:14   US Ob Transvaginal  Result Date: 11/01/2015 CLINICAL DATA:  Patient with intermittent pelvic pain and cramping. Twin gestation. EXAM: TWIN OBSTETRIC <14WK [place] AND TRANSVAGINAL OB US COMPARISON:  None. FINDINGS: Number of IUPs:  2 Chorionicity/Amnionicity:  Dichorionic-diamniotic (thick membrane) TWIN 1 Yolk sac:  Present Embryo:  Not present Cardiac Activity: Not present MSD: 5.9  mm   5 w   2  d TWIN 2 Yolk sac:  Present Embryo:  Not present Cardiac Activity: Not  present MSD: 5.6  mm   5 w   2  d Subchorionic hemorrhage:  None visualized. Maternal uterus/adnexae: Normal right and left ovaries. Corpus luteal cysts demonstrated within the right ovary. Small amount of free fluid in the pelvis. IMPRESSION: Findings most compatible with twin intrauterine gestation with 2 gestational sacs and yolk sacs identified. Recommend follow-up quantitative B-HCG levels and follow-up US in 14 days to confirm and assess viability. This recommendation follows SRU consensus guidelines: Diagnostic Criteria for Nonviable Pregnancy Early in the First Trimester. Malva Limes Med 2013; 409:8119-14. Electronically Signed   By: Annia Belt M.D.   On: 11/01/2015 12:14   IUP @ [redacted]w[redacted]d, di/di twins gestation  MAU Management/MDM: Ordered labs and [place] and reviewed results.  IUP noted on [place], discussed findings of di/di twins with pt. This is not a desired pregnancy and she may terminate but is undecided.  Will treat for vaginal candida which pt reports is recurrent for her and for BV. Pt desires Diflucan for yeast, and is aware of risks of pregnancy loss in first trimester.  Pt given contact information for OB/Gyn providers and list of safe meds in pregnancy.   Pt plans to contact [facility] or [facility]/Gyn as desired.  Treatments in MAU included Tylenol 650 mg PO x 1 dose which resolved h/a. Pt stable at time of discharge.  ASSESSMENT 1. Normal IUP (intrauterine pregnancy) on prenatal ultrasound, first trimester   2. Pelvic pain affecting pregnancy in first trimester, antepartum   3. Dichorionic diamniotic twin pregnancy in first trimester   4. BV (bacterial vaginosis)   5. Candidiasis of vagina     PLAN Discharge home   Medication List    STOP taking these medications   ibuprofen 200 MG tablet Commonly known as:  ADVIL,MOTRIN     TAKE these medications   fluconazole 150 MG tablet Commonly known as:  DIFLUCAN Take 1 tablet (150 mg total) by mouth once.   metroNIDAZOLE 500 MG  tablet Commonly known as:  FLAGYL Take 1 tablet (500 mg total) by mouth 2 (two) times daily.  Follow-up Information    Prenatal provider of your choice .   Why:  Return to MAU as needed for emergencies          Sharen Counter Certified Nurse-Midwife 11/01/2015  6:13 PM

## 2015-11-01 NOTE — Discharge Instructions (Signed)
Bacterial Vaginosis °Bacterial vaginosis is a vaginal infection that occurs when the normal balance of bacteria in the vagina is disrupted. It results from an overgrowth of certain bacteria. This is the most common vaginal infection in women of childbearing age. Treatment is important to prevent complications, especially in pregnant women, as it can cause a premature delivery. °CAUSES  °Bacterial vaginosis is caused by an increase in harmful bacteria that are normally present in smaller amounts in the vagina. Several different kinds of bacteria can cause bacterial vaginosis. However, the reason that the condition develops is not fully understood. °RISK FACTORS °Certain activities or behaviors can put you at an increased risk of developing bacterial vaginosis, including: °· Having a new sex partner or multiple sex partners. °· Douching. °· Using an intrauterine device (IUD) for contraception. °Women do not get bacterial vaginosis from toilet seats, bedding, swimming pools, or contact with objects around them. °SIGNS AND SYMPTOMS  °Some women with bacterial vaginosis have no signs or symptoms. Common symptoms include: °· Grey vaginal discharge. °· A fishlike odor with discharge, especially after sexual intercourse. °· Itching or burning of the vagina and vulva. °· Burning or pain with urination. °DIAGNOSIS  °Your health care provider will take a medical history and examine the vagina for signs of bacterial vaginosis. A sample of vaginal fluid may be taken. Your health care provider will look at this sample under a microscope to check for bacteria and abnormal cells. A vaginal pH test may also be done.  °TREATMENT  °Bacterial vaginosis may be treated with antibiotic medicines. These may be given in the form of a pill or a vaginal cream. A second round of antibiotics may be prescribed if the condition comes back after treatment. Because bacterial vaginosis increases your risk for sexually transmitted diseases, getting  treated can help reduce your risk for chlamydia, gonorrhea, HIV, and herpes. °HOME CARE INSTRUCTIONS  °· Only take over-the-counter or prescription medicines as directed by your health care provider. °· If antibiotic medicine was prescribed, take it as directed. Make sure you finish it even if you start to feel better. °· Tell all sexual partners that you have a vaginal infection. They should see their health care provider and be treated if they have problems, such as a mild rash or itching. °· During treatment, it is important that you follow these instructions: °· Avoid sexual activity or use condoms correctly. °· Do not douche. °· Avoid alcohol as directed by your health care provider. °· Avoid breastfeeding as directed by your health care provider. °SEEK MEDICAL CARE IF:  °· Your symptoms are not improving after 3 days of treatment. °· You have increased discharge or pain. °· You have a fever. °MAKE SURE YOU:  °· Understand these instructions. °· Will watch your condition. °· Will get help right away if you are not doing well or get worse. °FOR MORE INFORMATION  °Centers for Disease Control and Prevention, Division of STD Prevention: www.cdc.gov/std °American Sexual Health Association (ASHA): www.ashastd.org  °  °This information is not intended to replace advice given to you by your health care provider. Make sure you discuss any questions you have with your health care provider. °  °Document Released: 02/08/2005 Document Revised: 03/01/2014 Document Reviewed: 09/20/2012 °Elsevier Interactive Patient Education ©2016 Elsevier Inc. °Abdominal Pain During Pregnancy °Abdominal pain is common in pregnancy. Most of the time, it does not cause harm. There are many causes of abdominal pain. Some causes are more serious than others. Some of the causes   of abdominal pain in pregnancy are easily diagnosed. Occasionally, the diagnosis takes time to understand. Other times, the cause is not determined. Abdominal pain can be  a sign that something is very wrong with the pregnancy, or the pain may have nothing to do with the pregnancy at all. For this reason, always tell your health care provider if you have any abdominal discomfort. °HOME CARE INSTRUCTIONS  °Monitor your abdominal pain for any changes. The following actions may help to alleviate any discomfort you are experiencing: °· Do not have sexual intercourse or put anything in your vagina until your symptoms go away completely. °· Get plenty of rest until your pain improves. °· Drink clear fluids if you feel nauseous. Avoid solid food as long as you are uncomfortable or nauseous. °· Only take over-the-counter or prescription medicine as directed by your health care provider. °· Keep all follow-up appointments with your health care provider. °SEEK IMMEDIATE MEDICAL CARE IF: °· You are bleeding, leaking fluid, or passing tissue from the vagina. °· You have increasing pain or cramping. °· You have persistent vomiting. °· You have painful or bloody urination. °· You have a fever. °· You notice a decrease in your baby's movements. °· You have extreme weakness or feel faint. °· You have shortness of breath, with or without abdominal pain. °· You develop a severe headache with abdominal pain. °· You have abnormal vaginal discharge with abdominal pain. °· You have persistent diarrhea. °· You have abdominal pain that continues even after rest, or gets worse. °MAKE SURE YOU:  °· Understand these instructions. °· Will watch your condition. °· Will get help right away if you are not doing well or get worse. °  °This information is not intended to replace advice given to you by your health care provider. Make sure you discuss any questions you have with your health care provider. °  °Document Released: 02/08/2005 Document Revised: 11/29/2012 Document Reviewed: 09/07/2012 °Elsevier Interactive Patient Education ©2016 Elsevier Inc. ° °

## 2015-11-01 NOTE — MAU Note (Signed)
Having sharp abdominal pain for a few days thinks her LMP around 8/10

## 2015-11-02 LAB — HIV ANTIBODY (ROUTINE TESTING W REFLEX): HIV Screen 4th Generation wRfx: NONREACTIVE

## 2015-11-02 LAB — RPR: RPR: NONREACTIVE

## 2015-11-03 LAB — POCT PREGNANCY, URINE: PREG TEST UR: POSITIVE — AB

## 2015-11-03 LAB — CULTURE, OB URINE

## 2015-11-03 LAB — GC/CHLAMYDIA PROBE AMP (~~LOC~~) NOT AT ARMC
CHLAMYDIA, DNA PROBE: NEGATIVE
NEISSERIA GONORRHEA: NEGATIVE

## 2015-11-04 DIAGNOSIS — R8271 Bacteriuria: Secondary | ICD-10-CM

## 2015-11-24 ENCOUNTER — Inpatient Hospital Stay (HOSPITAL_COMMUNITY): Payer: Medicaid Other

## 2015-11-24 ENCOUNTER — Inpatient Hospital Stay (HOSPITAL_COMMUNITY)
Admission: AD | Admit: 2015-11-24 | Discharge: 2015-11-24 | Disposition: A | Discharge status: Home / Self Care | Payer: Medicaid Other | Source: Ambulatory Visit | Attending: Obstetrics and Gynecology | Admitting: Obstetrics and Gynecology

## 2015-11-24 ENCOUNTER — Encounter (HOSPITAL_COMMUNITY): Payer: Self-pay

## 2015-11-24 DIAGNOSIS — O200 Threatened abortion: Secondary | ICD-10-CM | POA: Diagnosis not present

## 2015-11-24 DIAGNOSIS — Z3A01 Less than 8 weeks gestation of pregnancy: Secondary | ICD-10-CM | POA: Diagnosis not present

## 2015-11-24 DIAGNOSIS — O209 Hemorrhage in early pregnancy, unspecified: Secondary | ICD-10-CM | POA: Diagnosis not present

## 2015-11-24 DIAGNOSIS — Z87891 Personal history of nicotine dependence: Secondary | ICD-10-CM | POA: Insufficient documentation

## 2015-11-24 DIAGNOSIS — Z9889 Other specified postprocedural states: Secondary | ICD-10-CM

## 2015-11-24 LAB — CBC
HCT: 36 % (ref 36.0–46.0)
Hemoglobin: 11.8 g/dL — ABNORMAL LOW (ref 12.0–15.0)
MCH: 32.3 pg (ref 26.0–34.0)
MCHC: 32.8 g/dL (ref 30.0–36.0)
MCV: 98.6 fL (ref 78.0–100.0)
PLATELETS: 350 10*3/uL (ref 150–400)
RBC: 3.65 MIL/uL — ABNORMAL LOW (ref 3.87–5.11)
RDW: 13.7 % (ref 11.5–15.5)
WBC: 6.4 10*3/uL (ref 4.0–10.5)

## 2015-11-24 LAB — POCT PREGNANCY, URINE: Preg Test, Ur: POSITIVE — AB

## 2015-11-24 LAB — HCG, QUANTITATIVE, PREGNANCY: HCG, BETA CHAIN, QUANT, S: 99 m[IU]/mL — AB (ref <5)

## 2015-11-24 MED ORDER — KETOROLAC TROMETHAMINE 60 MG/2ML IM SOLN
60.0000 mg | Freq: Once | INTRAMUSCULAR | Status: AC
  Administered 2015-11-24: 60 mg via INTRAMUSCULAR
  Filled 2015-11-24: qty 2

## 2015-11-24 NOTE — MAU Note (Signed)
Urine in lab 

## 2015-11-24 NOTE — MAU Note (Signed)
Pt had an abortion 2 weeks ago. Pt thought she had started her period about three days after the abortion. The bleeding then stopped. Pt states yesterday she started having heavy bleeding with large clots. Pt c/o abdominal pain that also started yesterday.

## 2015-11-24 NOTE — MAU Provider Note (Signed)
History     CSN: [id number]  Arrival date and time: 11/24/15 1541   None     Chief Complaint  Patient presents with  . Vaginal Bleeding   HPI   Ms.Sara Chen is a 20 y.o. female G1P0010 @ Unknown here in MAU with vaginal bleeding. [facility] and had an abortion, she was [redacted] weeks pregnant with twin gestation.  She had a D & E at that time.  She started bleeding 3 days after the procedure. It lasted a few days and then stopped. She started bleeding really heavy yesterday. She was passing large clots. She denies dizziness.   OB History    Gravida Para Term Preterm AB Living   1 0 0 0 1 0   SAB TAB Ectopic Multiple Live Births   0 1 0 0        Past Medical History:  Diagnosis Date  . Anxiety   . Infection    UTI  . Medical history non-contributory     Past Surgical History:  Procedure Laterality Date  . NO PAST SURGERIES    . THERAPEUTIC ABORTION      Family History  Problem Relation Age of Onset  . Asthma Father   . Diabetes Maternal Grandmother   . Hypertension Maternal Grandmother   . Diabetes Paternal Grandmother   . Hypertension Paternal Grandmother   . Asthma Sister     Social History  Substance Use Topics  . Smoking status: Former Games developermoker  . Smokeless tobacco: Never Used     Comment: was smoking hookah pipe, none in past couple months  . Alcohol use Yes     Comment: occ    Allergies:  Allergies  Allergen Reactions  . Latex Swelling  . Benadryl [Diphenhydramine] Rash    Prescriptions Prior to Admission  Medication Sig Dispense Refill Last Dose  . ibuprofen (ADVIL,MOTRIN) 200 MG tablet Take 200 mg by mouth every 6 (six) hours as needed for cramping.   11/23/2015 at Unknown time  . metroNIDAZOLE (FLAGYL) 500 MG tablet Take 1 tablet (500 mg total) by mouth 2 (two) times daily. (Patient not taking: Reported on 11/24/2015) 14 tablet 0 Not Taking at Unknown time   Results for orders placed or performed during the  hospital encounter of 11/24/15 (from the past 48 hour(s))  CBC     Status: Abnormal   Collection Time: 11/24/15  6:07 PM  Result Value Ref Range   WBC 6.4 4.0 - 10.5 K/uL   RBC 3.65 (L) 3.87 - 5.11 MIL/uL   Hemoglobin 11.8 (L) 12.0 - 15.0 g/dL   HCT 40.936.0 81.136.0 - 91.446.0 %   MCV 98.6 78.0 - 100.0 fL   MCH 32.3 26.0 - 34.0 pg   MCHC 32.8 30.0 - 36.0 g/dL   RDW 78.213.7 95.611.5 - 21.315.5 %   Platelets 350 150 - 400 K/uL  hCG, quantitative, pregnancy     Status: Abnormal   Collection Time: 11/24/15  6:07 PM  Result Value Ref Range   hCG, Beta Chain, Quant, S 99 (H) <5 mIU/mL    Comment:          GEST. AGE      CONC.  (mIU/mL)   <=1 WEEK        5 - 50     2 WEEKS       50 - 500     3 WEEKS       100 - 10,000  4 WEEKS     1,000 - 30,000     5 WEEKS     3,500 - 115,000   6-8 WEEKS     12,000 - 270,000    12 WEEKS     15,000 - 220,000        FEMALE AND NON-PREGNANT FEMALE:     LESS THAN 5 mIU/mL   Pregnancy, urine POC     Status: Abnormal   Collection Time: 11/24/15  6:15 PM  Result Value Ref Range   Preg Test, Ur POSITIVE (A) NEGATIVE    Comment:        THE SENSITIVITY OF THIS METHODOLOGY IS >24 mIU/mL     Review of Systems  Constitutional: Negative for chills and fever.  Gastrointestinal: Positive for abdominal pain. Negative for nausea and vomiting.  Neurological: Negative for dizziness.   Physical Exam   Blood pressure 123/80, pulse 100, temperature 98.3 F (36.8 C), temperature source Oral, resp. rate 18, height 5\' 2"  (1.575 m), weight 135 lb (61.2 kg), last menstrual period 10/02/2015, SpO2 100 %, unknown if currently breastfeeding.  Physical Exam  Nursing note and vitals reviewed. Constitutional: She is oriented to person, place, and time. She appears well-developed and well-nourished. No distress.  HENT:  Head: Normocephalic.  Cardiovascular: Normal rate.   Respiratory: Effort normal.  GI: Soft. There is no tenderness.  Genitourinary:  Genitourinary Comments:    External: no lesion Vagina: small amount of blood seen  Cervix: pink, smooth, no CMT Uterus: NSSC Adnexa: NT   Neurological: She is alert and oriented to person, place, and time.  Skin: Skin is warm. She is not diaphoretic.  Psychiatric: She has a normal mood and affect.   Results for orders placed or performed during the hospital encounter of 11/24/15 (from the past 24 hour(s))  CBC     Status: Abnormal   Collection Time: 11/24/15  6:07 PM  Result Value Ref Range   WBC 6.4 4.0 - 10.5 K/uL   RBC 3.65 (L) 3.87 - 5.11 MIL/uL   Hemoglobin 11.8 (L) 12.0 - 15.0 g/dL   HCT 16.1 09.6 - 04.5 %   MCV 98.6 78.0 - 100.0 fL   MCH 32.3 26.0 - 34.0 pg   MCHC 32.8 30.0 - 36.0 g/dL   RDW 40.9 81.1 - 91.4 %   Platelets 350 150 - 400 K/uL  hCG, quantitative, pregnancy     Status: Abnormal   Collection Time: 11/24/15  6:07 PM  Result Value Ref Range   hCG, Beta Chain, Quant, S 99 (H) <5 mIU/mL  Pregnancy, urine POC     Status: Abnormal   Collection Time: 11/24/15  6:15 PM  Result Value Ref Range   Preg Test, Ur POSITIVE (A) NEGATIVE   US Ob Transvaginal  Result Date: 11/24/2015 CLINICAL DATA:  Bleeding after therapeutic abortion 2 weeks ago. Beta HCG 99, previously 5,495 on September 9. EXAM: TRANSVAGINAL OB ULTRASOUND TECHNIQUE: Transvaginal ultrasound was performed for complete evaluation of the gestation as well as the maternal uterus, adnexal regions, and pelvic cul-de-sac. COMPARISON:  November 01, 2015 FINDINGS: Intrauterine gestational sac: None Yolk sac:  None Embryo:  None Cardiac Activity: None Heart Rate: Not  applicable MSD:  Not applicable CRL:   No fetal pole noted Subchorionic hemorrhage:  None visualized. Maternal uterus/adnexae: No intrauterine or ectopic pregnancy. Endometrial lining is 2 mm in thickness and homogeneous without retained products of conception identified. Small follicle on the right measuring 1.4 x 1.1 x 1.4 cm. IMPRESSION:  No intrauterine or ectopic pregnancy. No  retained products of conception. Electronically Signed   By: Tollie Eth M.D.   On: 11/24/2015 20:05    MAU Course  Procedures  None  MDM  O positive blood type  Toradol 60 mg IM> patient would like IM medication before exam due to discomfort.  [place] CBC Hcg  Report given to Thressa Sheller CNM who resumes care of the patient. [place] report pending.   Duane Lope, NP 11/24/2015 8:07 PM Patient has had toradol for pain, and she reports that her pain is better at this time.   Assessment and Plan   1. Status post therapeutic abortion   2. Vaginal bleeding in pregnancy, first trimester    DC home Comfort measures reviewed  Bleeding precautions RX: none, continue ibuprofen PRN  Return to MAU as needed FU with OB as planned  Follow-up Information    [facility]   Specialty:  Obstetrics and Gynecology Why:  11:00 am on THURSDAY  Contact information: [address] [place] [address] [phone]        \

## 2015-11-24 NOTE — Discharge Instructions (Signed)
°  SEEK IMMEDIATE MEDICAL CARE IF:  °· You have severe cramps or pain in your back or abdomen. °· You have a fever. °· You pass large blood clots (walnut-sized or larger) or tissue from your vagina. Save any tissue for your caregiver to inspect.   °· Your bleeding increases.   °· You have a thick, bad-smelling vaginal discharge. °· You become lightheaded, weak, or you faint.   °· You have chills.   °MAKE SURE YOU: °· Understand these instructions. °· Will watch your condition. °· Will get help right away if you are not doing well or get worse. °  °This information is not intended to replace advice given to you by your health care provider. Make sure you discuss any questions you have with your health care provider. °  °Document Released: 08/04/2000 Document Revised: 06/05/2012 Document Reviewed: 03/30/2011 °Elsevier Interactive Patient Education ©2016 Elsevier Inc. ° ° °

## 2015-11-27 ENCOUNTER — Ambulatory Visit: Payer: Medicaid Other | Admitting: *Deleted

## 2015-11-27 DIAGNOSIS — Z9889 Other specified postprocedural states: Secondary | ICD-10-CM

## 2015-11-27 LAB — HCG, QUANTITATIVE, PREGNANCY: hCG, Beta Chain, Quant, S: 55 m[IU]/mL — ABNORMAL HIGH (ref <5)

## 2015-11-27 NOTE — Progress Notes (Signed)
Pt in for 48 hr hcg level. She had a tab 2 weeks ago and has been experiencing pain and heavy bleeding. Ultrasound was normal. hcg level 99. Pt still cramping and bleeding today. She will wait for results today.  Todays level is 55. Pt to return in 2 weeks for repeat hcg level. She will also make an appointment for birth control in the near future. Advised patient to use condoms for protection.

## 2015-12-12 ENCOUNTER — Other Ambulatory Visit: Payer: Medicaid Other

## 2015-12-25 ENCOUNTER — Ambulatory Visit: Payer: Medicaid Other | Admitting: Family Medicine

## 2016-06-15 IMAGING — CR DG WRIST COMPLETE 3+V*L*
4 series · 4 of 4 positions shown · non-contrast
Comparison: None.

CLINICAL DATA: WRIST INJURY

EXAM:
LEFT WRIST - COMPLETE 3+ VIEW

[view not recorded (1 of 4)]
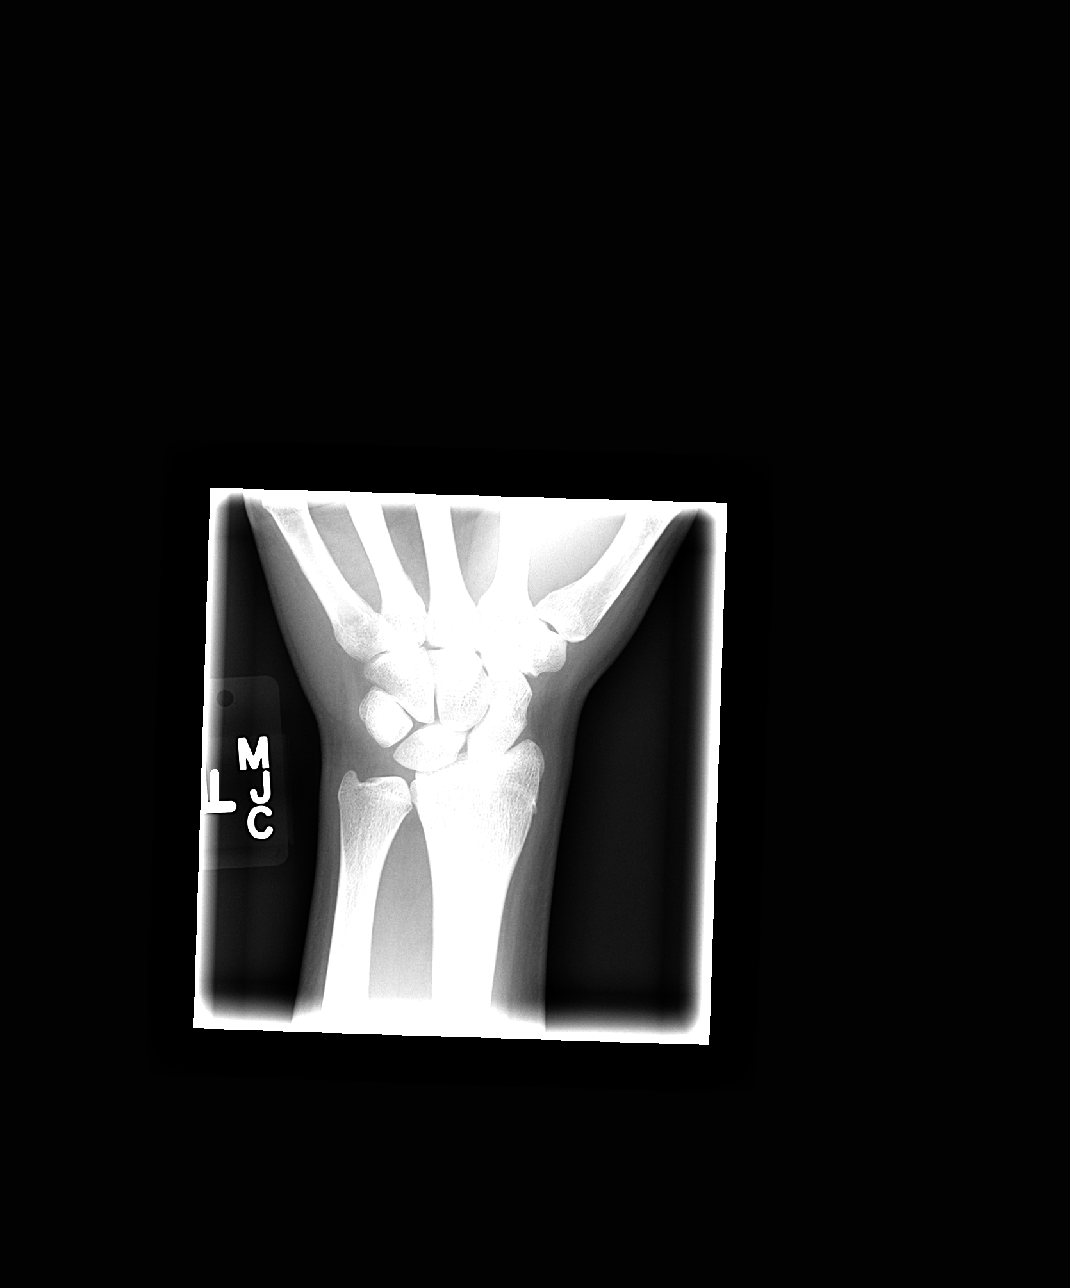

[view not recorded (2 of 4)]
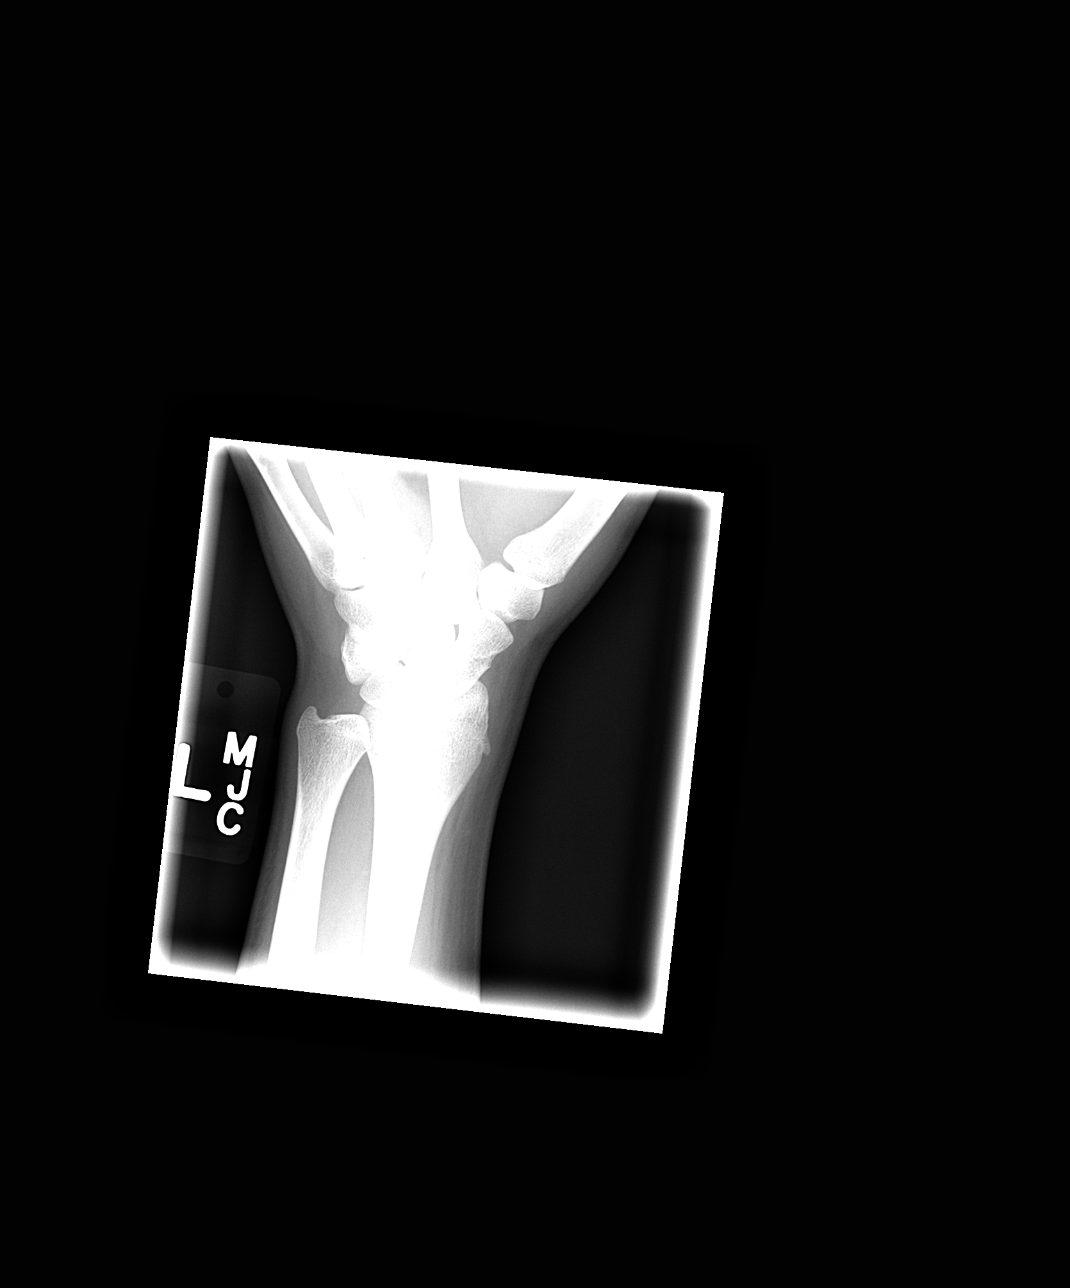

[view not recorded (3 of 4)]
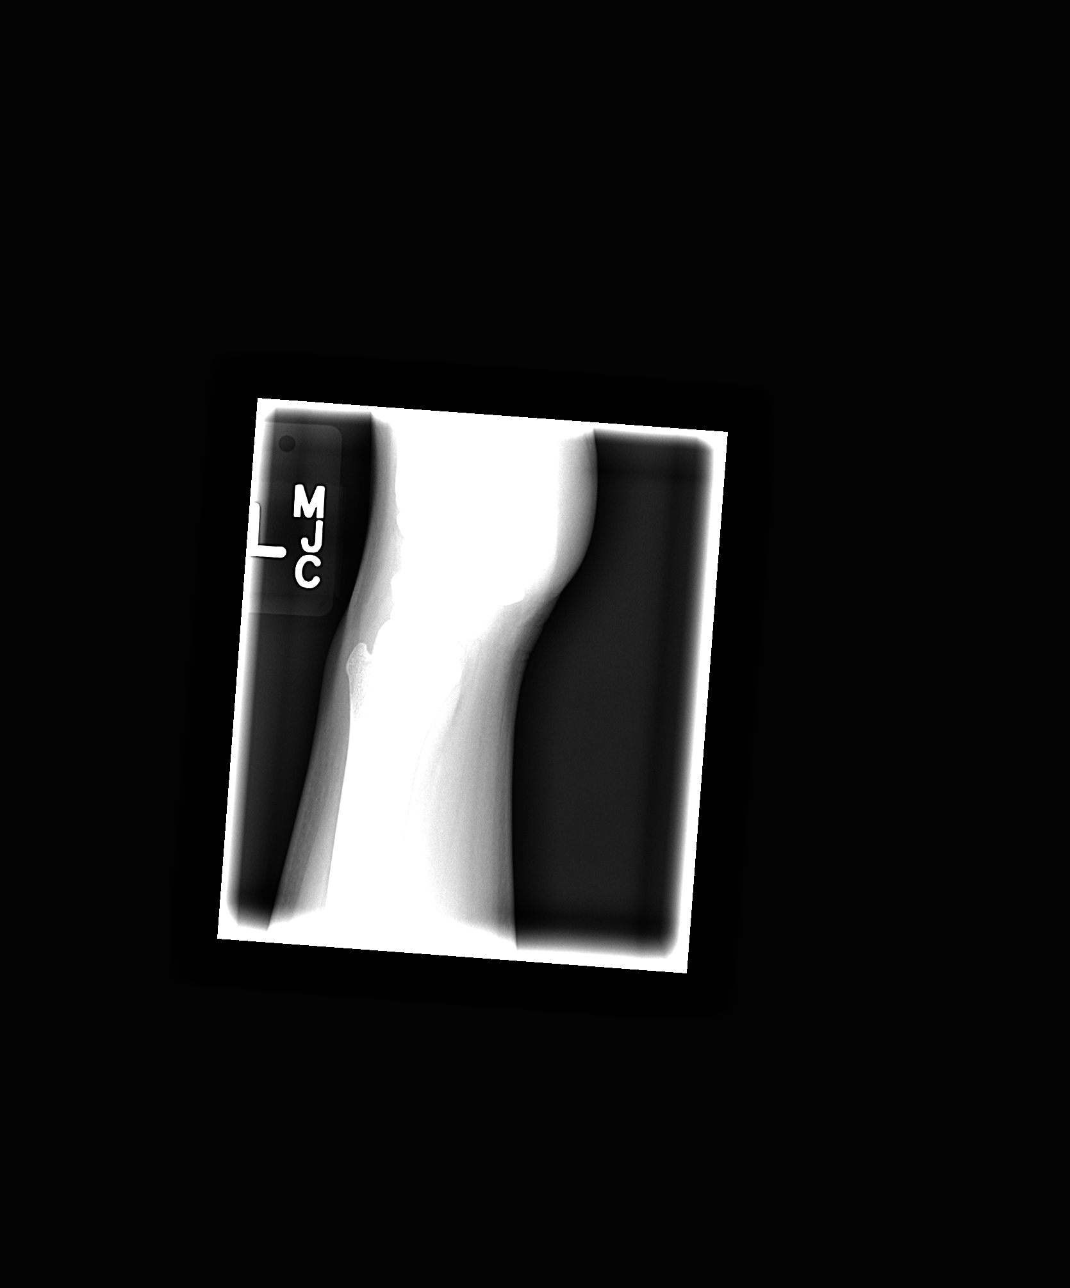

[view not recorded (4 of 4)]
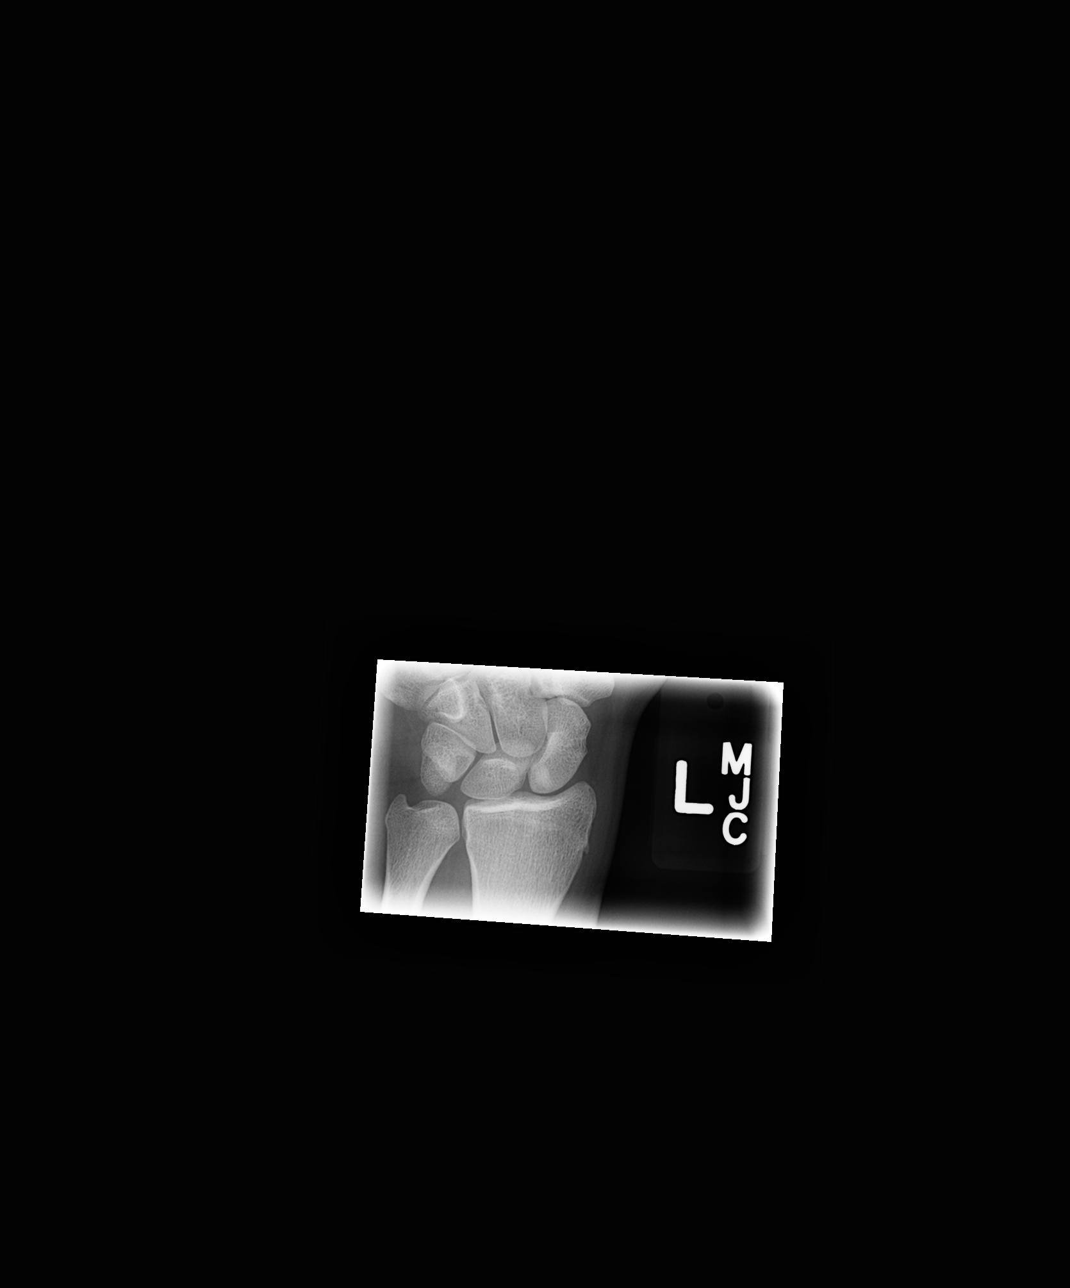

[4 of 4 positions shown; findings below may reference images not displayed]

FINDINGS: There is no evidence of fracture or dislocation. There is no
evidence of arthropathy or other focal bone abnormality. Soft
tissues are unremarkable.
IMPRESSION: Negative.

## 2016-08-25 ENCOUNTER — Inpatient Hospital Stay (HOSPITAL_COMMUNITY)
Admission: AD | Admit: 2016-08-25 | Discharge: 2016-08-25 | Disposition: A | Discharge status: Home / Self Care | Payer: Medicaid Other | Source: Ambulatory Visit | Attending: Family Medicine | Admitting: Family Medicine

## 2016-08-25 ENCOUNTER — Encounter (HOSPITAL_COMMUNITY): Payer: Self-pay | Admitting: *Deleted

## 2016-08-25 DIAGNOSIS — B373 Candidiasis of vulva and vagina: Secondary | ICD-10-CM | POA: Insufficient documentation

## 2016-08-25 DIAGNOSIS — Z3202 Encounter for pregnancy test, result negative: Secondary | ICD-10-CM | POA: Insufficient documentation

## 2016-08-25 DIAGNOSIS — B3731 Acute candidiasis of vulva and vagina: Secondary | ICD-10-CM

## 2016-08-25 DIAGNOSIS — F17290 Nicotine dependence, other tobacco product, uncomplicated: Secondary | ICD-10-CM | POA: Insufficient documentation

## 2016-08-25 DIAGNOSIS — N760 Acute vaginitis: Secondary | ICD-10-CM

## 2016-08-25 DIAGNOSIS — B9689 Other specified bacterial agents as the cause of diseases classified elsewhere: Secondary | ICD-10-CM | POA: Insufficient documentation

## 2016-08-25 LAB — URINALYSIS, ROUTINE W REFLEX MICROSCOPIC
BILIRUBIN URINE: NEGATIVE
GLUCOSE, UA: NEGATIVE mg/dL
HGB URINE DIPSTICK: NEGATIVE
KETONES UR: NEGATIVE mg/dL
NITRITE: NEGATIVE
PROTEIN: NEGATIVE mg/dL
Specific Gravity, Urine: 1.003 — ABNORMAL LOW (ref 1.005–1.030)
pH: 6 (ref 5.0–8.0)

## 2016-08-25 LAB — POCT PREGNANCY, URINE: PREG TEST UR: NEGATIVE

## 2016-08-25 LAB — WET PREP, GENITAL
Sperm: NONE SEEN
Trich, Wet Prep: NONE SEEN

## 2016-08-25 MED ORDER — METRONIDAZOLE 0.75 % VA GEL: 1.0000 | Freq: Every day | VAGINAL | 0 refills | Status: AC

## 2016-08-25 MED ORDER — FLUCONAZOLE 150 MG PO TABS
150.0000 mg | ORAL_TABLET | ORAL | Status: DC
  Administered 2016-08-25: 150 mg via ORAL
  Filled 2016-08-25: qty 1

## 2016-08-25 MED ORDER — FLUCONAZOLE 150 MG PO TABS: 150.0000 mg | ORAL_TABLET | ORAL | 0 refills | Status: AC

## 2016-08-25 NOTE — MAU Note (Signed)
PT  SAYS SHE HAS BEEN GOING  TO  B-ROOM OFTEN.  HAS USED OTC MEDS - SAW BLOOD  IN  HER URINE.  HAS  VAG D/C   .    NO BIRTH  CONTROL. LAST SEX- 6-30      PT IS IN SCHOOL AT [employer]    GOES   TO  SCHOOL DR.

## 2016-08-25 NOTE — MAU Provider Note (Signed)
History     CSN: [id number]  Arrival date and time: 08/25/16 0440   First Provider Initiated Contact with Patient 08/25/16 [phone]      Chief Complaint  Patient presents with  . vaginal irritation  . Vaginal Discharge  . Urinary Frequency   HPI  Sara Chen is a 21 yo G1P0010 non-pregnant presenting with complaints of UTI sx's: frequency & dysuria.  She also complains of vaginal irritation.  She has been taking OTC Azo with minimal relief.  Past Medical History:  Diagnosis Date  . Anxiety   . Infection    UTI  . Medical history non-contributory     Past Surgical History:  Procedure Laterality Date  . NO PAST SURGERIES    . THERAPEUTIC ABORTION      Family History  Problem Relation Age of Onset  . Asthma Father   . Diabetes Maternal Grandmother   . Hypertension Maternal Grandmother   . Diabetes Paternal Grandmother   . Hypertension Paternal Grandmother   . Asthma Sister     Social History  Substance Use Topics  . Smoking status: Current Some Day Smoker    Types: Pipe  . Smokeless tobacco: Never Used     Comment: was smoking hookah pipe, none in past couple months  . Alcohol use Yes     Comment: occ    Allergies:  Allergies  Allergen Reactions  . Latex Swelling  . Benadryl [Diphenhydramine] Rash    Prescriptions Prior to Admission  Medication Sig Dispense Refill Last Dose  . ibuprofen (ADVIL,MOTRIN) 200 MG tablet Take 200 mg by mouth every 6 (six) hours as needed for cramping.   11/23/2015 at Unknown time    Review of Systems  Constitutional: Negative.   HENT: Negative.   Eyes: Negative.   Respiratory: Negative.   Cardiovascular: Negative.   Gastrointestinal: Negative.   Endocrine: Negative.   Genitourinary: Positive for dysuria and frequency.  Musculoskeletal: Negative.   Skin: Negative.   Allergic/Immunologic: Negative.   Neurological: Negative.   Hematological: Negative.   Psychiatric/Behavioral: Negative.    Physical Exam   Blood  pressure 115/70, pulse (!) 111, temperature 98.6 F (37 C), temperature source Oral, resp. rate 18, height 5\' 2"  (1.575 m), weight 61.7 kg (136 lb), last menstrual period 08/15/2016.  Physical Exam  Constitutional: She is oriented to person, place, and time. She appears well-developed and well-nourished.  HENT:  Head: Normocephalic.  Eyes: Pupils are equal, round, and reactive to light.  Neck: Normal range of motion.  Cardiovascular: Normal rate.   Respiratory: Effort normal and breath sounds normal.  GI: Soft. Bowel sounds are normal.  Genitourinary:  Genitourinary Comments: Wet prep and GC/CT obtained by patient  Musculoskeletal: Normal range of motion.  Neurological: She is alert and oriented to person, place, and time. She has normal reflexes.  Skin: Skin is warm and dry.  Psychiatric: She has a normal mood and affect. Her behavior is normal. Judgment and thought content normal.    MAU Course  Procedures  MDM CCUA UPT Wet Prep -- self-collected GC/CT-- self-collected Diflucan 150 mg po now per pt request  Results for orders placed or performed during the hospital encounter of 08/25/16 (from the past 24 hour(s))  Urinalysis, Routine w reflex microscopic     Status: Abnormal   Collection Time: 08/25/16  4:45 AM  Result Value Ref Range   Color, Urine STRAW (A) YELLOW   APPearance CLEAR CLEAR   Specific Gravity, Urine 1.003 (L) 1.005 - 1.030  pH 6.0 5.0 - 8.0   Glucose, UA NEGATIVE NEGATIVE mg/dL   Hgb urine dipstick NEGATIVE NEGATIVE   Bilirubin Urine NEGATIVE NEGATIVE   Ketones, ur NEGATIVE NEGATIVE mg/dL   Protein, ur NEGATIVE NEGATIVE mg/dL   Nitrite NEGATIVE NEGATIVE   Leukocytes, UA SMALL (A) NEGATIVE   RBC / HPF 0-5 0 - 5 RBC/hpf   WBC, UA 0-5 0 - 5 WBC/hpf   Bacteria, UA RARE (A) NONE SEEN   Squamous Epithelial / LPF 0-5 (A) NONE SEEN  Pregnancy, urine POC     Status: None   Collection Time: 08/25/16  4:57 AM  Result Value Ref Range   Preg Test, Ur  NEGATIVE NEGATIVE  Wet prep, genital     Status: Abnormal   Collection Time: 08/25/16  6:40 AM  Result Value Ref Range   Yeast Wet Prep HPF POC PRESENT (A) NONE SEEN   Trich, Wet Prep NONE SEEN NONE SEEN   Clue Cells Wet Prep HPF POC PRESENT (A) NONE SEEN   WBC, Wet Prep HPF POC MODERATE (A) NONE SEEN   Sperm NONE SEEN     Assessment and Plan  Bacterial vaginitis - Rx for Metrogel 0.75% 1 applicatorful pv x 5 days - Instructions on BV given  Candida vaginitis - Diflucan 150 mg x 1 now, then Rx 1 tab po every 3 days x 2 doses - Instructions on yeast infection given Discharge home Patient verbalized an understanding of the plan of care and agrees.   Raelyn Mora, MSN, CNM 08/25/2016, 8:03 AM

## 2016-08-26 LAB — GC/CHLAMYDIA PROBE AMP (~~LOC~~) NOT AT ARMC
CHLAMYDIA, DNA PROBE: NEGATIVE
Neisseria Gonorrhea: NEGATIVE

## 2016-12-12 ENCOUNTER — Emergency Department (HOSPITAL_COMMUNITY)
Admission: EM | Admit: 2016-12-12 | Discharge: 2016-12-12 | Disposition: A | Discharge status: Home / Self Care | Payer: 59 | Attending: Emergency Medicine | Admitting: Emergency Medicine

## 2016-12-12 ENCOUNTER — Encounter (HOSPITAL_COMMUNITY): Payer: Self-pay | Admitting: Emergency Medicine

## 2016-12-12 DIAGNOSIS — N760 Acute vaginitis: Secondary | ICD-10-CM | POA: Insufficient documentation

## 2016-12-12 DIAGNOSIS — N898 Other specified noninflammatory disorders of vagina: Secondary | ICD-10-CM | POA: Diagnosis present

## 2016-12-12 DIAGNOSIS — B9689 Other specified bacterial agents as the cause of diseases classified elsewhere: Secondary | ICD-10-CM | POA: Diagnosis not present

## 2016-12-12 DIAGNOSIS — F17200 Nicotine dependence, unspecified, uncomplicated: Secondary | ICD-10-CM | POA: Insufficient documentation

## 2016-12-12 DIAGNOSIS — Z91040 Latex allergy status: Secondary | ICD-10-CM | POA: Diagnosis not present

## 2016-12-12 LAB — WET PREP, GENITAL
SPERM: NONE SEEN
Trich, Wet Prep: NONE SEEN
YEAST WET PREP: NONE SEEN

## 2016-12-12 LAB — POC URINE PREG, ED: Preg Test, Ur: NEGATIVE

## 2016-12-12 MED ORDER — METRONIDAZOLE 0.75 % EX GEL: 1.0000 "application " | Freq: Every day | CUTANEOUS | 0 refills | Status: AC

## 2016-12-12 NOTE — ED Notes (Signed)
Urine sample collected in triage. ENMiles 

## 2016-12-12 NOTE — ED Notes (Signed)
Bed: WTR9 Expected date:  Expected time:  Means of arrival:  Comments: 

## 2016-12-12 NOTE — ED Notes (Signed)
Pt placed in gown.

## 2016-12-12 NOTE — ED Notes (Signed)
Pt verbalized understanding of discharge instructions and denies any further questions at this time.   

## 2016-12-12 NOTE — ED Notes (Signed)
Communication with Lab area about results.

## 2016-12-12 NOTE — ED Provider Notes (Signed)
[facility]-EMERGENCY DEPT Provider Note   CSN: [id number] Arrival date & time: 12/12/16  0805     History   Chief Complaint Chief Complaint  Patient presents with  . Vaginal Discharge    HPI Sara Chen is a 21 y.o. female.  The history is provided by the patient. No language interpreter was used.  Vaginal Discharge     Sara KenningJasnaya N Rufo is a 21 y.o. female who presents to the Emergency Department complaining of vaginal discharge.  She has a history of BV and reports similar foul-smelling vaginal discharge that began yesterday. No new sexual partners. No abdominal pain, fevers, dysuria. She last had an episode of BV 1 month ago. Symptoms are mild and constant nature. Past Medical History:  Diagnosis Date  . Anxiety   . Infection    UTI  . Medical history non-contributory     Patient Active Problem List   Diagnosis Date Noted  . Candida vaginitis 08/25/2016  . Bacterial vaginitis 08/25/2016  . Group B streptococcal bacteriuria 11/04/2015    Past Surgical History:  Procedure Laterality Date  . NO PAST SURGERIES    . THERAPEUTIC ABORTION      OB History    Gravida Para Term Preterm AB Living   1 0 0 0 1 0   SAB TAB Ectopic Multiple Live Births   0 1 0 0         Home Medications    Prior to Admission medications   Medication Sig Start Date End Date Taking? Authorizing Provider  ibuprofen (ADVIL,MOTRIN) 200 MG tablet Take 200 mg by mouth every 6 (six) hours as needed for cramping.   Yes [provider]  metroNIDAZOLE (METROGEL) 0.75 % gel Apply 1 application topically daily. 12/12/16 12/17/16  Tilden Fossaees, Bjorn Hallas, MD    Family History Family History  Problem Relation Age of Onset  . Asthma Father   . Diabetes Maternal Grandmother   . Hypertension Maternal Grandmother   . Diabetes Paternal Grandmother   . Hypertension Paternal Grandmother   . Asthma Sister     Social History Social History  Substance Use Topics  .  Smoking status: Current Some Day Smoker    Types: Pipe  . Smokeless tobacco: Never Used     Comment: was smoking hookah pipe, none in past couple months  . Alcohol use Yes     Comment: occ     Allergies   Latex and Benadryl [diphenhydramine]   Review of Systems Review of Systems  Genitourinary: Positive for vaginal discharge.  All other systems reviewed and are negative.    Physical Exam Updated Vital Signs BP 122/90 (BP Location: Left Arm)   Pulse 94   Temp 98.7 F (37.1 C) (Oral)   Resp 16   SpO2 100%   Physical Exam  Constitutional: She is oriented to person, place, and time. She appears well-developed and well-nourished.  HENT:  Head: Normocephalic and atraumatic.  Cardiovascular: Normal rate and regular rhythm.   Pulmonary/Chest: Effort normal. No respiratory distress.  Abdominal: Soft. There is no tenderness. There is no rebound and no guarding.  Genitourinary:  Genitourinary Comments: Scant white vaginal discharge. No CMT  Musculoskeletal: She exhibits no edema or tenderness.  Neurological: She is alert and oriented to person, place, and time.  Skin: Skin is warm and dry.  Psychiatric: She has a normal mood and affect. Her behavior is normal.  Nursing note and vitals reviewed.    ED Treatments / Results  Labs (  all labs ordered are listed, but only abnormal results are displayed) Labs Reviewed  WET PREP, GENITAL - Abnormal; Notable for the following:       Result Value   Clue Cells Wet Prep HPF POC PRESENT (*)    WBC, Wet Prep HPF POC MANY (*)    All other components within normal limits  POC URINE PREG, ED  GC/CHLAMYDIA PROBE AMP ([place]) NOT AT [facility]    EKG  EKG Interpretation None       Radiology No results found.  Procedures Procedures (including critical care time)  Medications Ordered in ED Medications - No data to display   Initial Impression / Assessment and Plan / ED Course  I have reviewed the triage vital signs and the  nursing notes.  Pertinent labs & imaging results that were available during my care of the patient were reviewed by me and considered in my medical decision making (see chart for details).     Patient here for evaluation of foul-smelling vaginal discharge. She has a nontender abdominal examination. Examination presentation are not consistent with PID, tubo-ovarian abscess. Discussed with patient home care for BV. Discussed outpatient follow up and return precautions.  Final Clinical Impressions(s) / ED Diagnoses   Final diagnoses:  BV (bacterial vaginosis)    New Prescriptions New Prescriptions   METRONIDAZOLE (METROGEL) 0.75 % GEL    Apply 1 application topically daily.     Tilden Fossa, MD 12/12/16 [phone]

## 2016-12-12 NOTE — ED Notes (Signed)
ED Provider at bedside. 

## 2016-12-12 NOTE — ED Triage Notes (Signed)
Pt reports she has been having foul smelling vaginal discharge. Hx of BV, feels like same. Tried to call clinic for a refill on medication for this, but did not get refill. Came here for treatment.

## 2016-12-13 LAB — GC/CHLAMYDIA PROBE AMP (~~LOC~~) NOT AT ARMC
Chlamydia: NEGATIVE
Neisseria Gonorrhea: NEGATIVE

## 2018-07-08 IMAGING — US US OB COMP LESS 14 WK
1 series · 15 of 28 positions shown · non-contrast
Comparison: None.

CLINICAL DATA: Patient with intermittent pelvic pain and cramping.
Twin gestation.

EXAM:
TWIN OBSTETRIC <14WK US AND TRANSVAGINAL OB US

[Series 1: us ob comp less 14 wk · 15 of 80 slices shown]
[im 1/80]
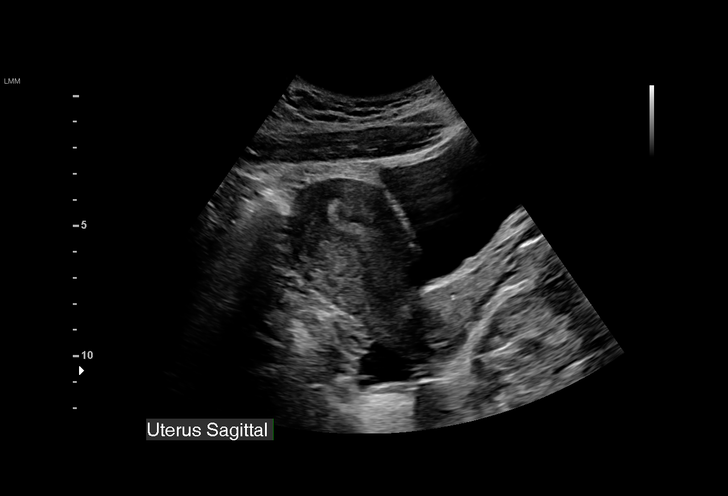
[im 6/80]
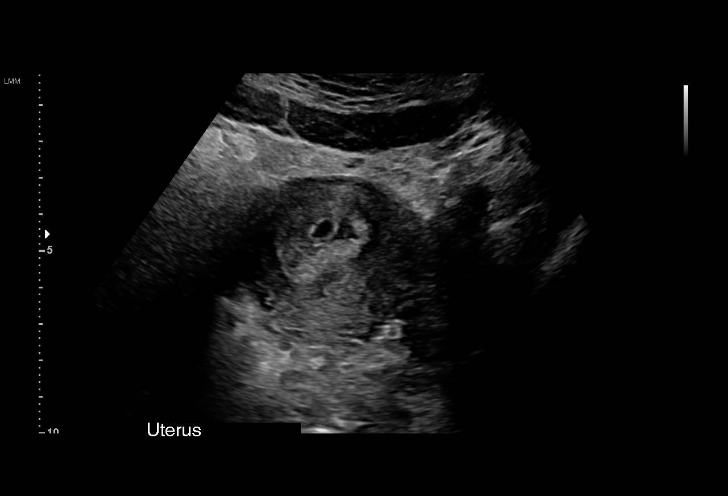
[im 12/80]
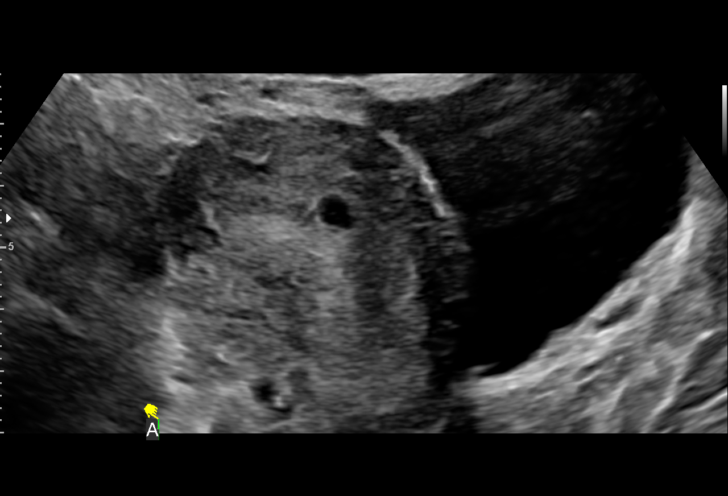
[im 18/80]
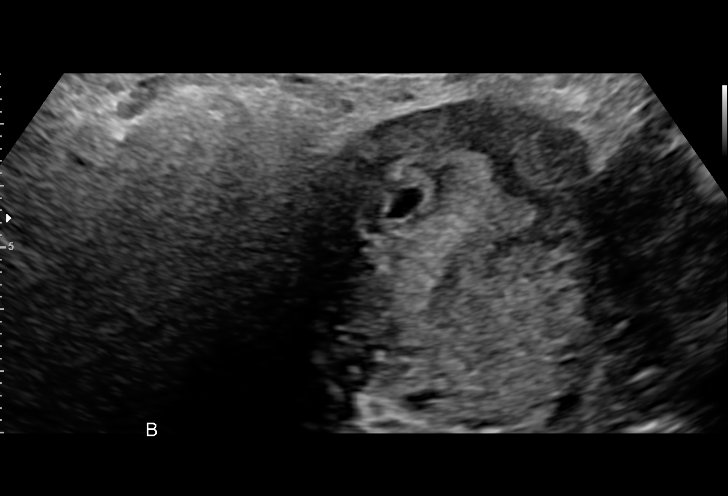
[im 24/80]
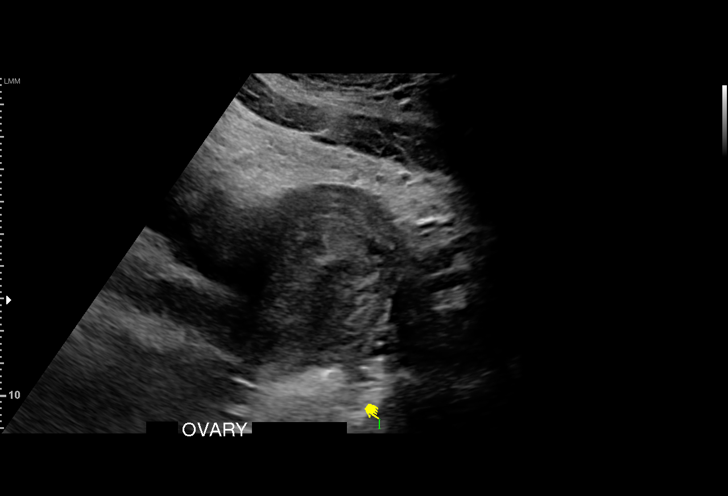
[im 30/80]
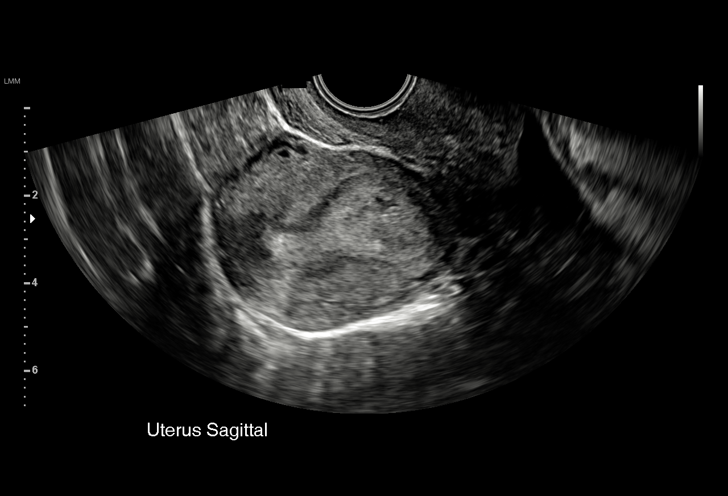
[im 36/80]
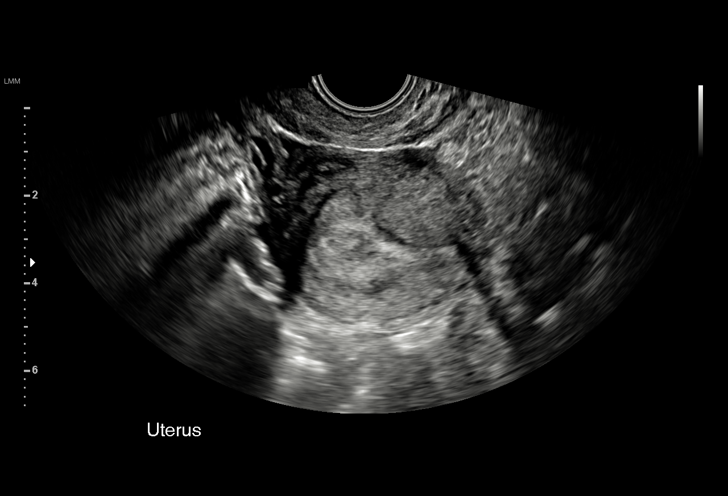
[im 41/80]
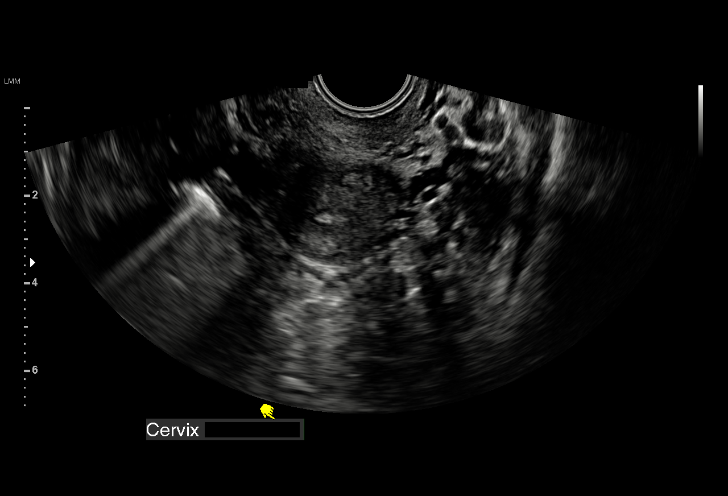
[im 44/80]
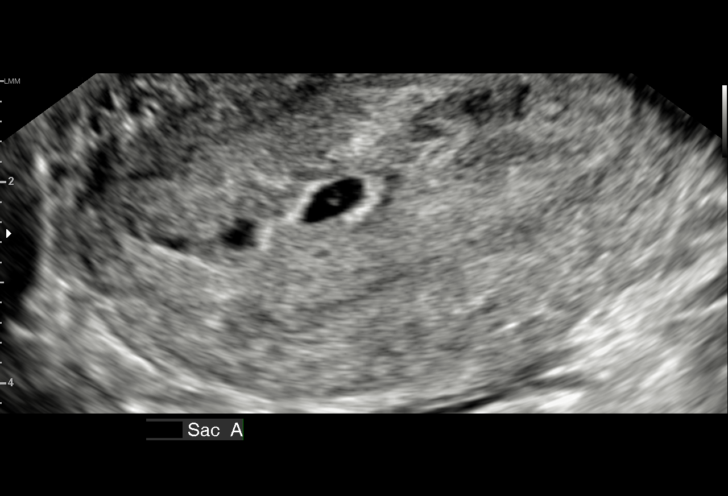
[im 50/80]
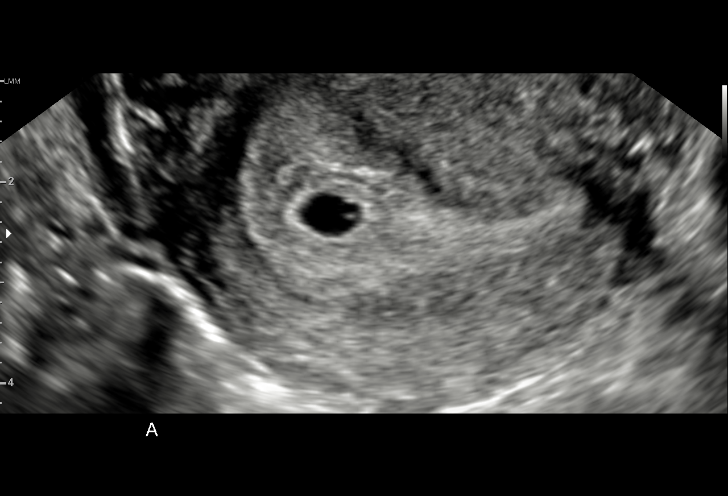
[im 56/80]
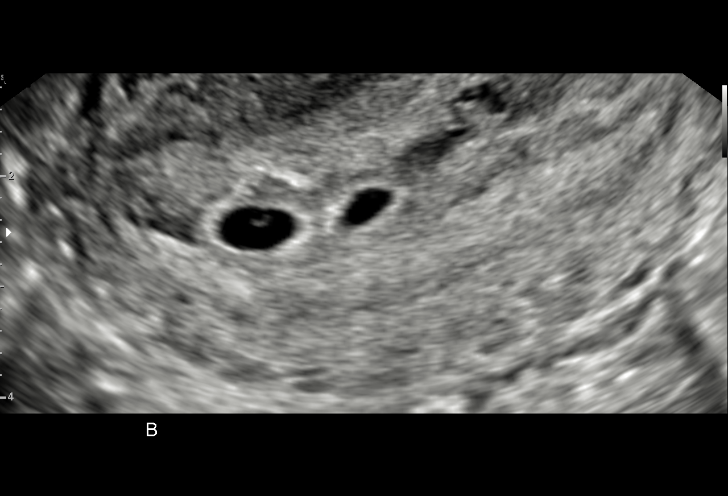
[im 62/80]
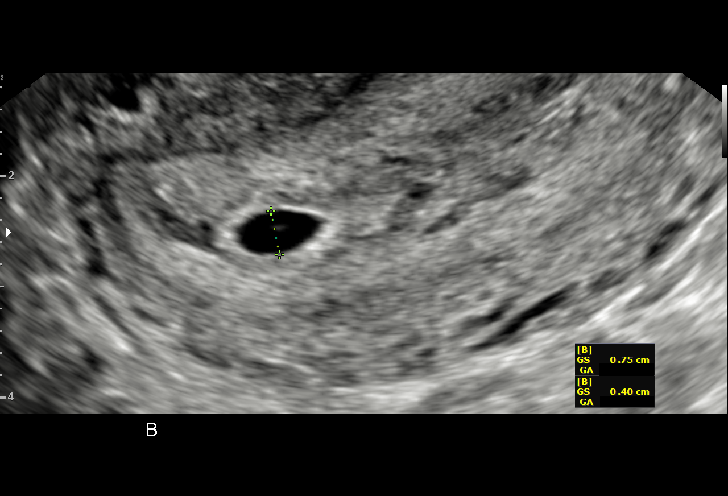
[im 68/80]
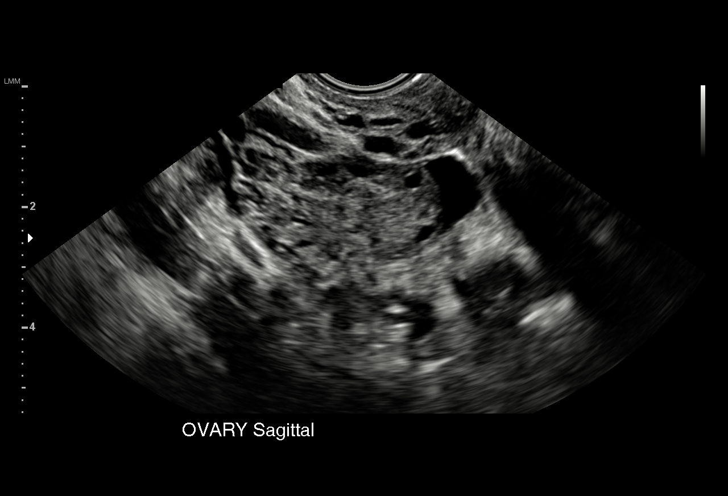
[im 74/80]
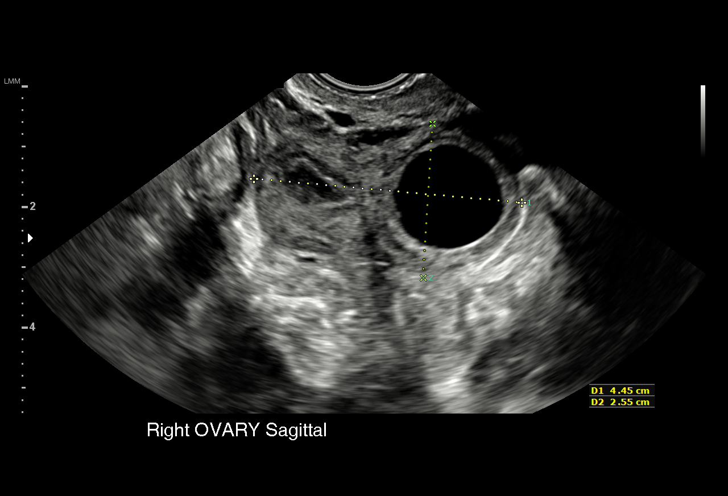
[im 80/80]
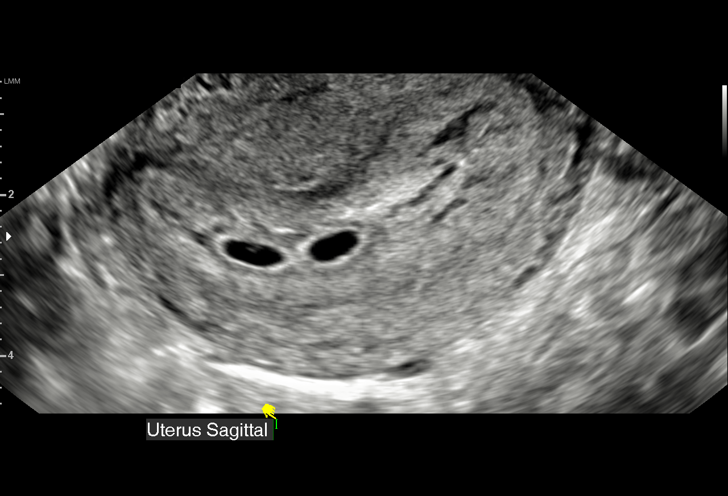

[15 of 28 positions shown; findings below may reference images not displayed]

FINDINGS: Number of IUPs:  2

Chorionicity/Amnionicity:  Dichorionic-diamniotic (thick membrane)

TWIN 1

Yolk sac:  Present

Embryo:  Not present

Cardiac Activity: Not present

MSD: 5.9  mm   5 w   2  d

TWIN 2

Yolk sac:  Present

Embryo:  Not present

Cardiac Activity: Not present

MSD: 5.6  mm   5 w   2  d

Subchorionic hemorrhage:  None visualized.

Maternal uterus/adnexae: Normal right and left ovaries. Corpus
luteal cysts demonstrated within the right ovary. Small amount of
free fluid in the pelvis.
IMPRESSION: Findings most compatible with twin intrauterine gestation with 2
gestational sacs and yolk sacs identified. Recommend follow-up
quantitative B-HCG levels and follow-up US in 14 days to confirm and
assess viability. This recommendation follows SRU consensus
guidelines: Diagnostic Criteria for Nonviable Pregnancy Early in the
First Trimester. N Engl J Med 3912; [DATE].

## 2018-07-31 IMAGING — US US OB TRANSVAGINAL
1 series · 15 of 28 positions shown · non-contrast
Comparison: November 01, 2015

CLINICAL DATA: Bleeding after therapeutic abortion 2 weeks ago.
Beta HCG 99, previously 5,495 on [DATE].

EXAM:
TRANSVAGINAL OB ULTRASOUND
TECHNIQUE: Transvaginal ultrasound was performed for complete evaluation of the
gestation as well as the maternal uterus, adnexal regions, and
pelvic cul-de-sac.

[Series 1: us ob transvaginal · 15 of 34 slices shown]
[im 1/34]
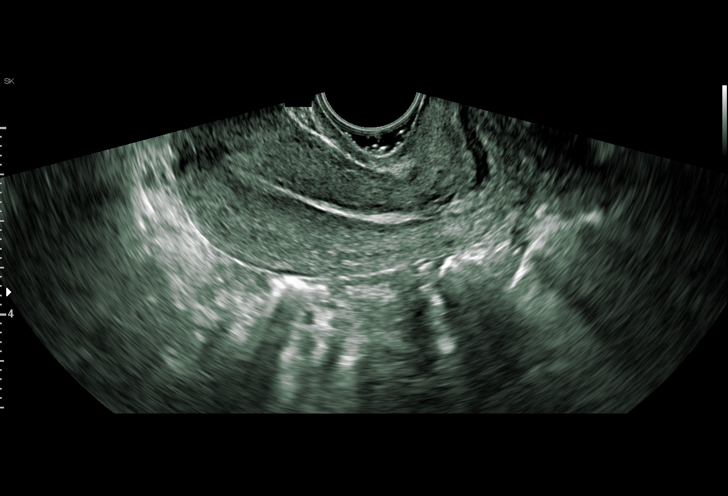
[im 3/34]
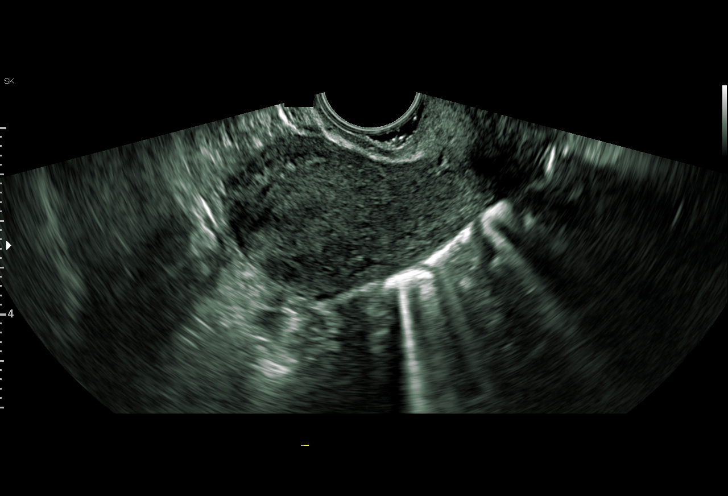
[im 5/34]
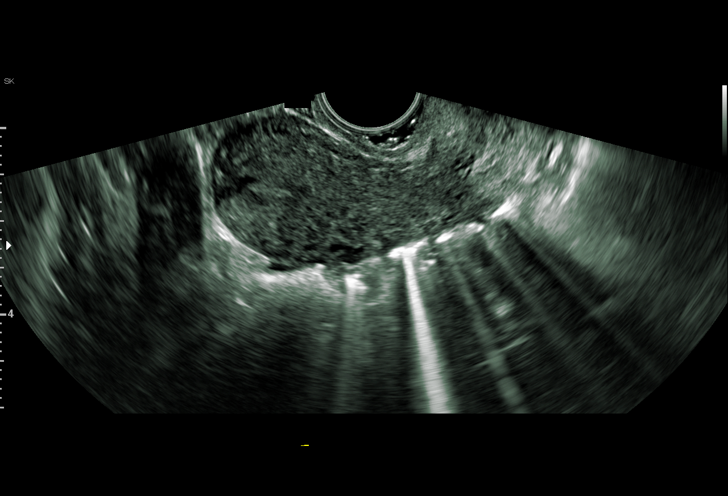
[im 8/34]
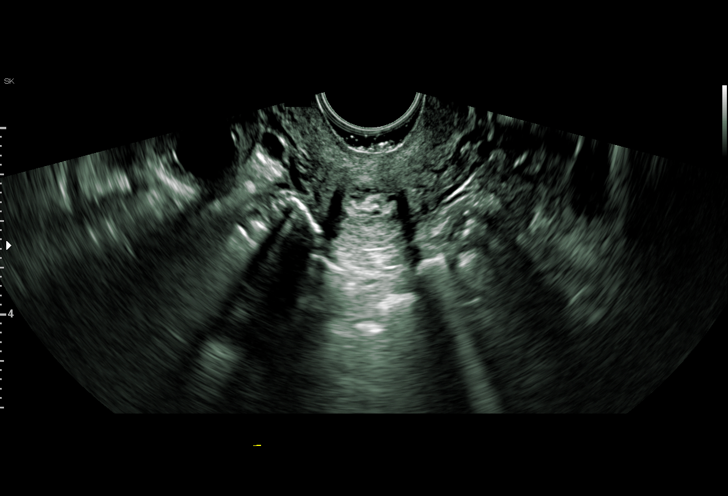
[im 10/34]
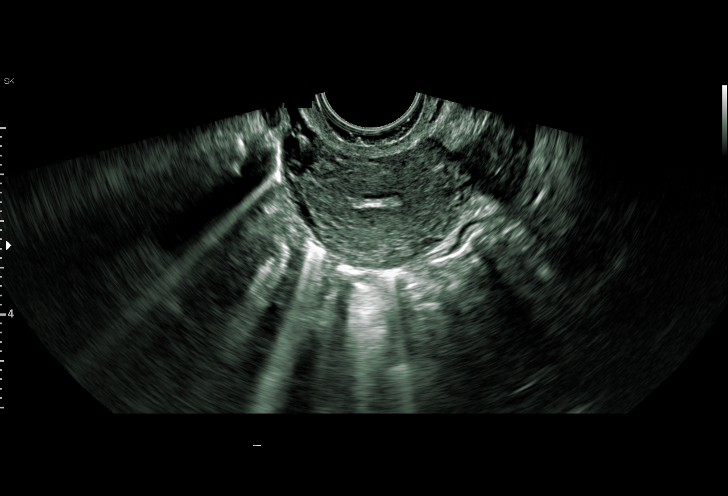
[im 13/34]
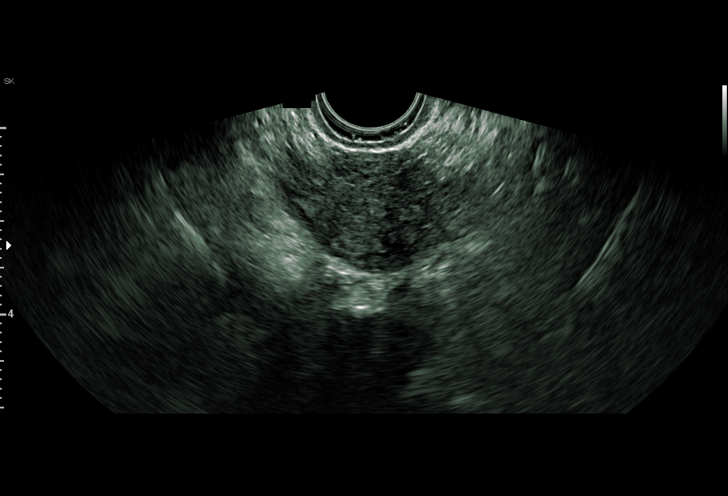
[im 15/34]
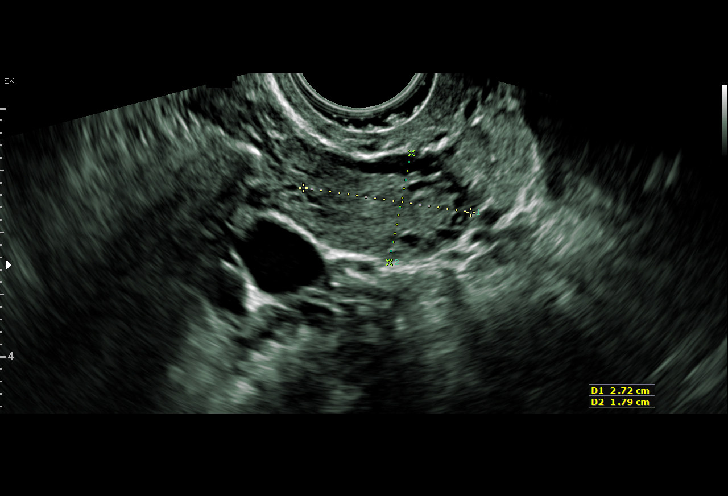
[im 18/34]
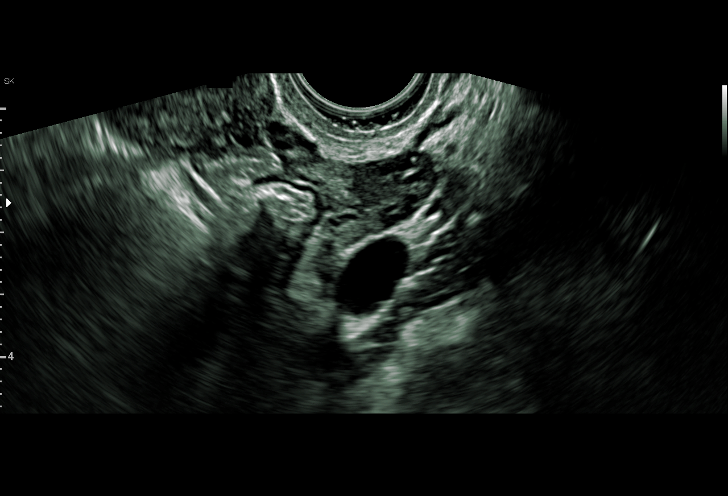
[im 19/34]
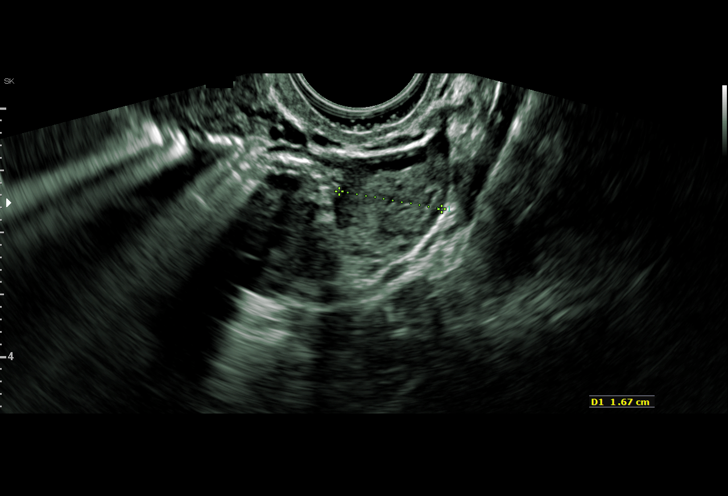
[im 21/34]
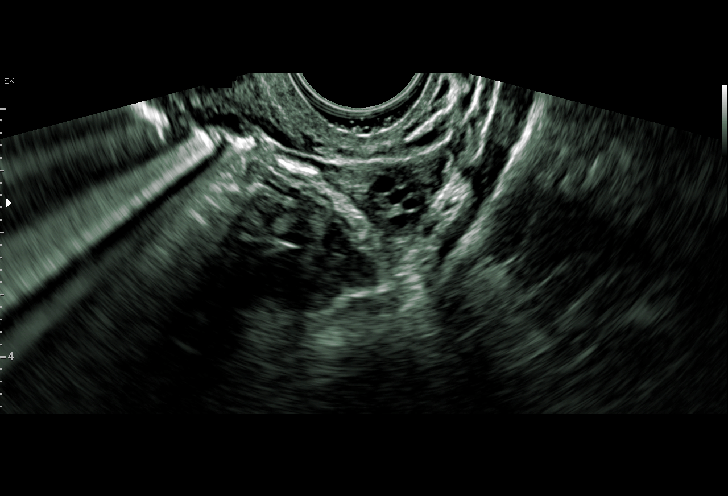
[im 24/34]
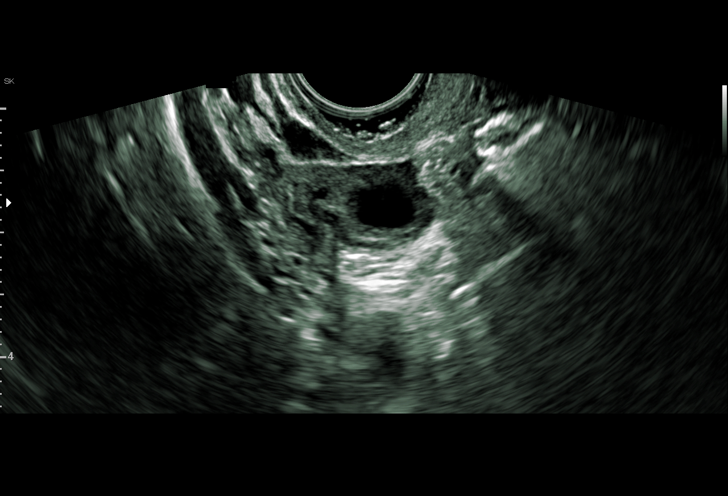
[im 26/34]
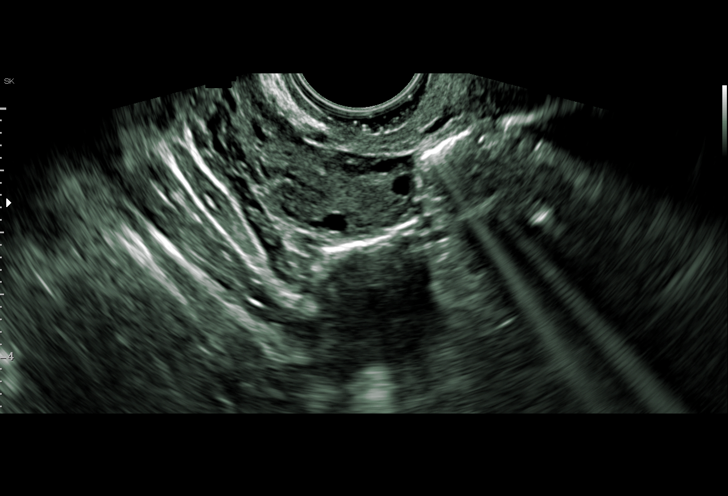
[im 29/34]
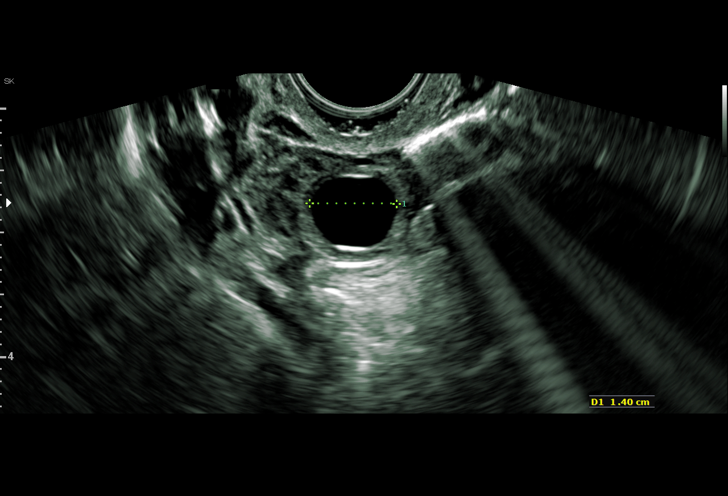
[im 31/34]
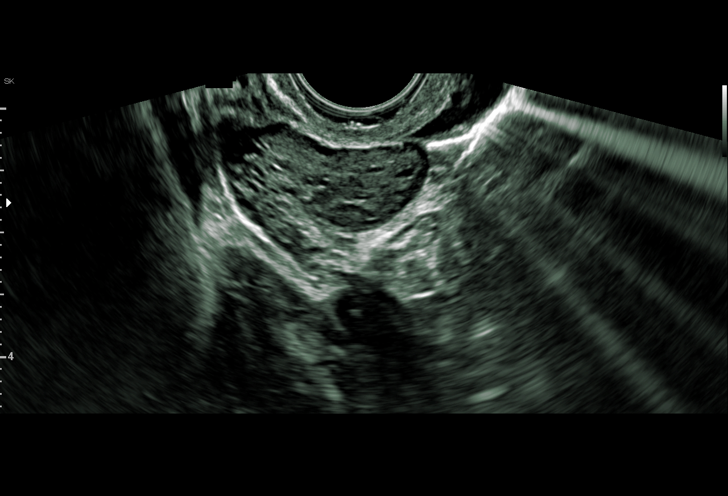
[im 34/34]
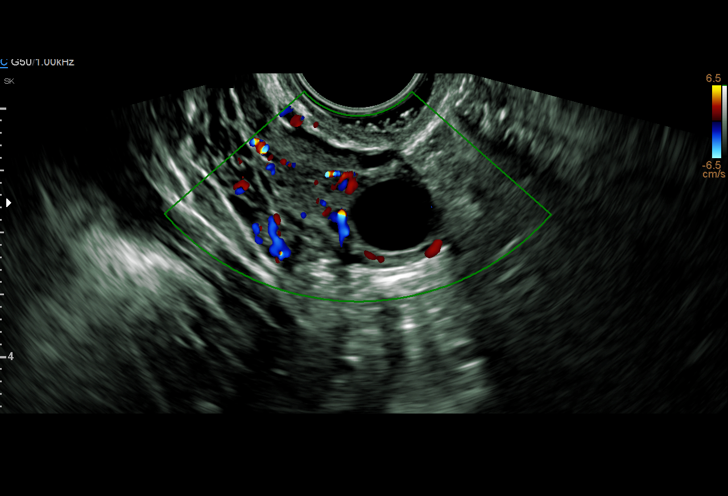

[15 of 28 positions shown; findings below may reference images not displayed]

FINDINGS: Intrauterine gestational sac: None

Yolk sac:  None

Embryo:  None

Cardiac Activity: None

Heart Rate: Not  applicable

MSD:  Not applicable

CRL:   No fetal pole noted

Subchorionic hemorrhage:  None visualized.

Maternal uterus/adnexae: No intrauterine or ectopic pregnancy.
Endometrial lining is 2 mm in thickness and homogeneous without
retained products of conception identified. Small follicle on the
right measuring 1.4 x 1.1 x 1.4 cm.
IMPRESSION: No intrauterine or ectopic pregnancy. No retained products of
conception.

## 2018-08-01 ENCOUNTER — Encounter (HOSPITAL_COMMUNITY): Payer: Self-pay | Admitting: *Deleted

## 2018-08-01 ENCOUNTER — Other Ambulatory Visit: Payer: Self-pay

## 2018-08-01 ENCOUNTER — Emergency Department (HOSPITAL_COMMUNITY)
Admission: EM | Admit: 2018-08-01 | Discharge: 2018-08-02 | Disposition: A | Discharge status: Home / Self Care | Payer: 59 | Attending: Emergency Medicine | Admitting: Emergency Medicine

## 2018-08-01 DIAGNOSIS — Z91040 Latex allergy status: Secondary | ICD-10-CM | POA: Insufficient documentation

## 2018-08-01 DIAGNOSIS — T7840XA Allergy, unspecified, initial encounter: Secondary | ICD-10-CM

## 2018-08-01 DIAGNOSIS — F17290 Nicotine dependence, other tobacco product, uncomplicated: Secondary | ICD-10-CM | POA: Insufficient documentation

## 2018-08-01 MED ORDER — METHYLPREDNISOLONE SODIUM SUCC 125 MG IJ SOLR
INTRAMUSCULAR | Status: AC
  Filled 2018-08-01: qty 2

## 2018-08-01 MED ORDER — LORATADINE 10 MG PO TABS
10.0000 mg | ORAL_TABLET | Freq: Once | ORAL | Status: AC
  Administered 2018-08-01: 10 mg via ORAL
  Filled 2018-08-01: qty 1

## 2018-08-01 MED ORDER — FAMOTIDINE IN NACL 20-0.9 MG/50ML-% IV SOLN
20.0000 mg | Freq: Once | INTRAVENOUS | Status: AC
  Administered 2018-08-01: 20 mg via INTRAVENOUS
  Filled 2018-08-01: qty 50

## 2018-08-01 MED ORDER — METHYLPREDNISOLONE SODIUM SUCC 125 MG IJ SOLR
125.0000 mg | Freq: Once | INTRAMUSCULAR | Status: AC
  Administered 2018-08-02: 125 mg via INTRAVENOUS

## 2018-08-01 NOTE — ED Triage Notes (Signed)
Pt sts had shrimp about 15 minutes pto and developed shortness of breath. Pt sts had it before but it hasn't happened in a while. Pt sts she is allergic to benadryl.

## 2018-08-01 NOTE — ED Notes (Signed)
Bed: FM73 Expected date:  Expected time:  Means of arrival:  Comments: Hold for res a

## 2018-08-02 ENCOUNTER — Encounter (HOSPITAL_COMMUNITY): Payer: Self-pay | Admitting: Emergency Medicine

## 2018-08-02 MED ORDER — LORATADINE 10 MG PO TABS: 10.0000 mg | ORAL_TABLET | Freq: Every day | ORAL | 0 refills | Status: AC

## 2018-08-02 MED ORDER — EPINEPHRINE 0.3 MG/0.3ML IJ SOAJ: 0.3000 mg | INTRAMUSCULAR | 0 refills | Status: AC | PRN

## 2018-08-02 MED ORDER — PREDNISONE 20 MG PO TABS: ORAL_TABLET | ORAL | 0 refills | Status: AC

## 2018-08-02 MED ORDER — FAMOTIDINE 20 MG PO TABS: 20.0000 mg | ORAL_TABLET | Freq: Two times a day (BID) | ORAL | 0 refills | Status: AC

## 2018-08-02 NOTE — ED Notes (Signed)
Pt ambulated to the bathroom without assistance. Gait steady  

## 2018-08-02 NOTE — ED Notes (Signed)
Pt left without receiving her discharge instructions and follow up care. IV was removed prior to leaving. Pt was alert and ambulatory. Pt in no acute distress

## 2018-08-02 NOTE — ED Provider Notes (Addendum)
[facility] DEPT Provider Note   CSN: [id number] Arrival date & time: 08/01/18  2334    History   Chief Complaint Chief Complaint  Patient presents with  . Allergic Reaction    HPI Sara Chen is a 23 y.o. female.     The history is provided by the patient.  Allergic Reaction  Presenting symptoms: difficulty breathing   Presenting symptoms: no difficulty swallowing, no itching, no rash, no swelling and no wheezing   Severity:  Moderate Prior allergic episodes:  Food/nut allergies (shrimp which she has had a reaction to in the past) Context: food   Relieved by:  Nothing Worsened by:  Nothing Ineffective treatments:  None tried   Past Medical History:  Diagnosis Date  . Anxiety   . Infection    UTI  . Medical history non-contributory     Patient Active Problem List   Diagnosis Date Noted  . Candida vaginitis 08/25/2016  . Bacterial vaginitis 08/25/2016  . Group B streptococcal bacteriuria 11/04/2015    Past Surgical History:  Procedure Laterality Date  . NO PAST SURGERIES    . THERAPEUTIC ABORTION       OB History    Gravida  1   Para  0   Term  0   Preterm  0   AB  1   Living  0     SAB  0   TAB  1   Ectopic  0   Multiple  0   Live Births               Home Medications    Prior to Admission medications   Medication Sig Start Date End Date Taking? Authorizing Provider  ibuprofen (ADVIL,MOTRIN) 200 MG tablet Take 200 mg by mouth every 6 (six) hours as needed for cramping.    [provider]    Family History Family History  Problem Relation Age of Onset  . Asthma Father   . Diabetes Maternal Grandmother   . Hypertension Maternal Grandmother   . Diabetes Paternal Grandmother   . Hypertension Paternal Grandmother   . Asthma Sister     Social History Social History   Tobacco Use  . Smoking status: Current Some Day Smoker    Types: Pipe  . Smokeless tobacco: Never Used  .  Tobacco comment: was smoking hookah pipe, none in past couple months  Substance Use Topics  . Alcohol use: Yes    Comment: occ  . Drug use: No    Frequency: 3.0 times per week     Allergies   Latex and Benadryl [diphenhydramine]   Review of Systems Review of Systems  Constitutional: Negative for fever.  HENT: Negative for drooling, facial swelling and trouble swallowing.   Respiratory: Negative for wheezing.   Cardiovascular: Negative for chest pain, palpitations and leg swelling.  Skin: Negative for itching and rash.  All other systems reviewed and are negative.    Physical Exam Updated Vital Signs BP 123/79   Pulse 100   Resp 13   SpO2 100%   Physical Exam Vitals signs and nursing note reviewed.  Constitutional:      General: She is not in acute distress.    Appearance: She is normal weight.  HENT:     Head: Normocephalic and atraumatic.     Comments: No swelling of the lips tongue or uvula    Nose: Nose normal.  Eyes:     Conjunctiva/sclera: Conjunctivae normal.  Pupils: Pupils are equal, round, and reactive to light.  Neck:     Musculoskeletal: Normal range of motion and neck supple.  Cardiovascular:     Rate and Rhythm: Normal rate and regular rhythm.     Pulses: Normal pulses.     Heart sounds: Normal heart sounds.  Pulmonary:     Effort: Pulmonary effort is normal. No respiratory distress.     Breath sounds: Normal breath sounds. No stridor. No wheezing, rhonchi or rales.  Abdominal:     General: Abdomen is flat. Bowel sounds are normal.     Tenderness: There is no abdominal tenderness. There is no guarding.  Musculoskeletal: Normal range of motion.  Skin:    General: Skin is warm and dry.     Capillary Refill: Capillary refill takes less than 2 seconds.     Findings: No rash.  Neurological:     General: No focal deficit present.     Mental Status: She is alert and oriented to person, place, and time.  Psychiatric:        Mood and Affect: Mood  is anxious.      ED Treatments / Results  Labs (all labs ordered are listed, but only abnormal results are displayed) Labs Reviewed - No data to display  EKG None  Radiology No results found.  Procedures Procedures (including critical care time)  Medications Ordered in ED Medications  famotidine (PEPCID) IVPB 20 mg premix (0 mg Intravenous Stopped 08/02/18 0028)  methylPREDNISolone sodium succinate (SOLU-MEDROL) 125 mg/2 mL injection 125 mg (125 mg Intravenous Given 08/02/18 0004)  loratadine (CLARITIN) tablet 10 mg (10 mg Oral Given 08/01/18 2353)     I suspect this is actually a panic attack.  There are no signs of allergic reaction. She was observed in the department  Final Clinical Impressions(s) / ED Diagnoses   Return for intractable cough, coughing up blood,fevers >100.4 unrelieved by medication, shortness of breath, intractable vomiting, chest pain, shortness of breath, weakness,numbness, changes in speech, facial asymmetry,abdominal pain, passing out,Inability to tolerate liquids or food, cough, altered mental status or any concerns. No signs of systemic illness or infection. The patient is nontoxic-appearing on exam and vital signs are within normal limits.   I have reviewed the triage vital signs and the nursing notes. Pertinent labs &imaging results that were available during my care of the patient were reviewed by me and considered in my medical decision making (see chart for details).  After history, exam, and medical workup I feel the patient has been appropriately medically screened and is safe for discharge home. Pertinent diagnoses were discussed with the patient. Patient was given return precautions   Caeden Foots, MD 08/02/18 [id number]    Camary Sosa, MD 08/02/18 [id number]

## 2018-08-02 NOTE — Discharge Instructions (Addendum)
DO not eat shellfish of any kind
